# Patient Record
Sex: Female | Born: 1990 | Race: Black or African American | Hispanic: No | Marital: Married | Smoking: Never smoker
Health system: Southern US, Community
[De-identification: ages and names within clinical notes are randomized; demographics above are authoritative.]

## PROBLEM LIST (undated history)

## (undated) ENCOUNTER — Inpatient Hospital Stay (HOSPITAL_COMMUNITY): Payer: Self-pay

## (undated) DIAGNOSIS — Z789 Other specified health status: Secondary | ICD-10-CM

## (undated) HISTORY — PX: NO PAST SURGERIES: SHX2092

---
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
## 2009-04-06 ENCOUNTER — Inpatient Hospital Stay (HOSPITAL_COMMUNITY): Admission: AD | Admit: 2009-04-06 | Discharge: 2009-04-06 | Payer: Self-pay | Admitting: Obstetrics and Gynecology

## 2009-07-12 ENCOUNTER — Ambulatory Visit (HOSPITAL_COMMUNITY): Admission: RE | Admit: 2009-07-12 | Discharge: 2009-07-12 | Payer: Self-pay | Admitting: Obstetrics

## 2009-10-25 IMAGING — US US OB COMP LESS 14 WK
1 series · 14 of 28 positions shown · non-contrast
Comparison: none

CLINICAL DATA: Positive pregnancy test. Vaginal bleeding. 
OBSTETRICAL ULTRASOUND <14 WKS AND TRANSVAGINAL OB US:
TECHNIQUE: Both transabdominal and transvaginal ultrasound examinations were performed for complete evaluation of the gestation as well as the maternal uterus, adnexal regions, and pelvic cul-de-sac.

[Series 1: us ob comp less 14 wk · 0.28mm/px · 14 of 31 slices shown]
[im 2/31]
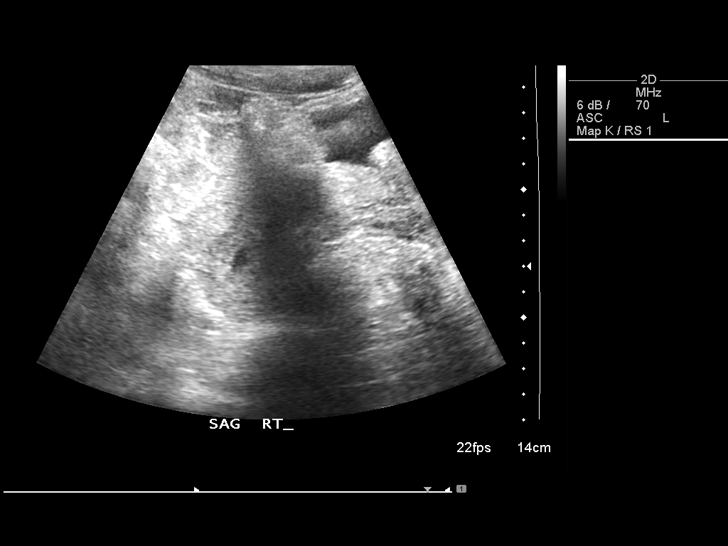
[im 4/31]
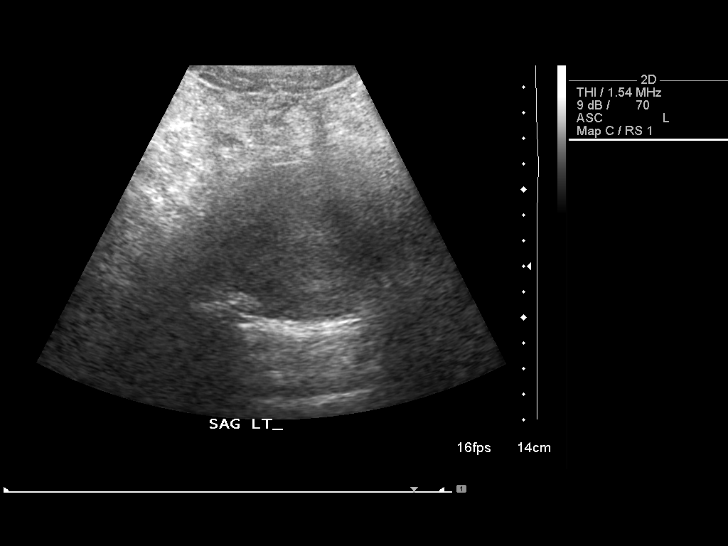
[im 6/31]
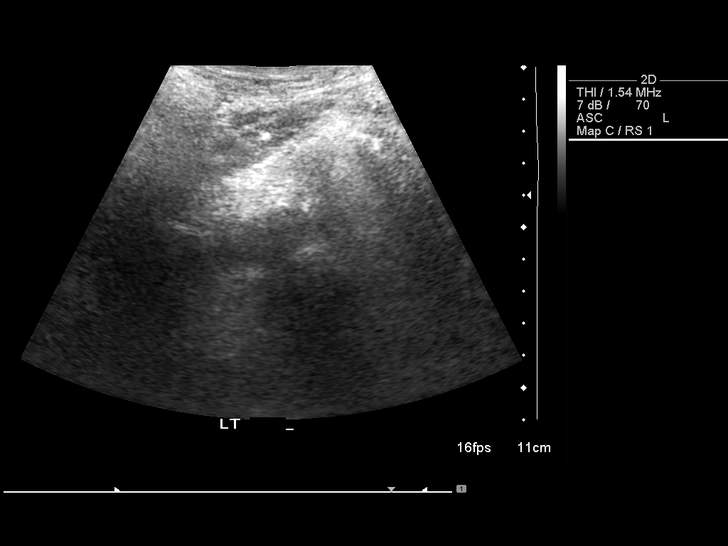
[im 8/31]
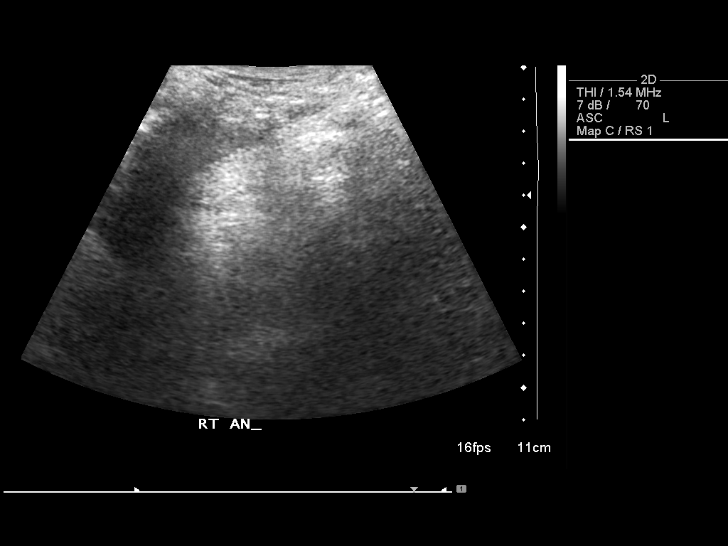
[im 11/31]
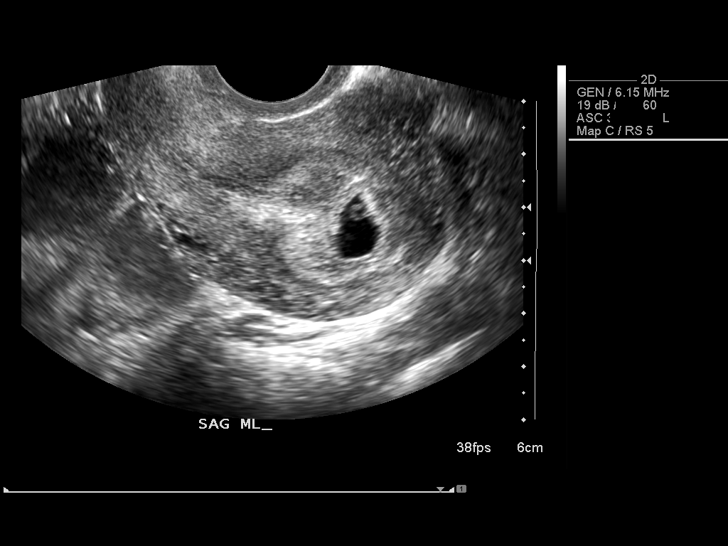
[im 13/31]
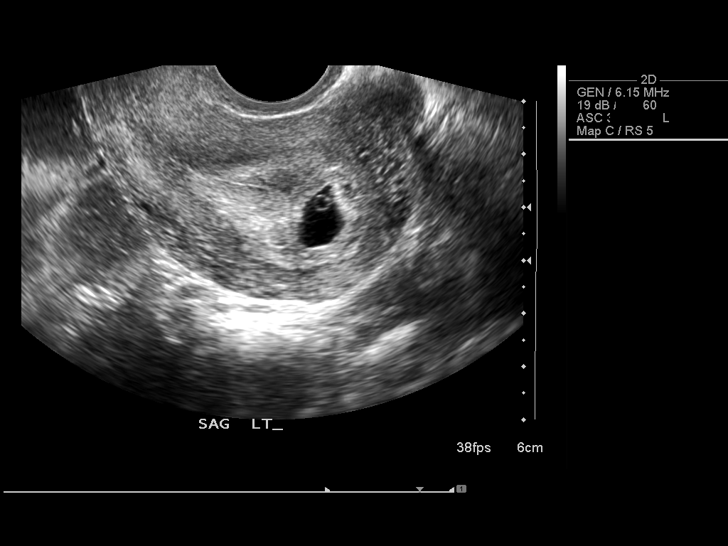
[im 15/31]
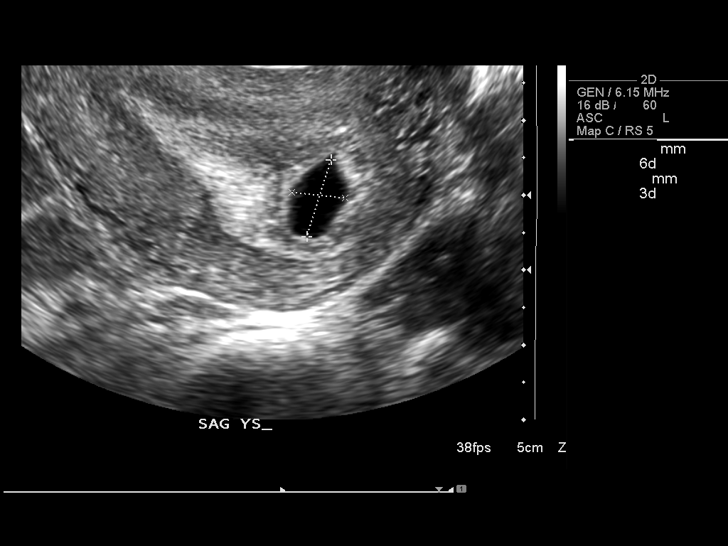
[im 17/31]
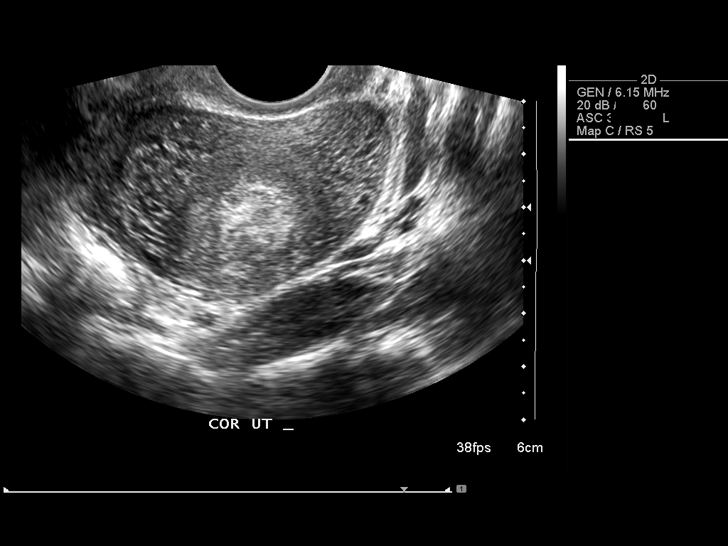
[im 19/31]
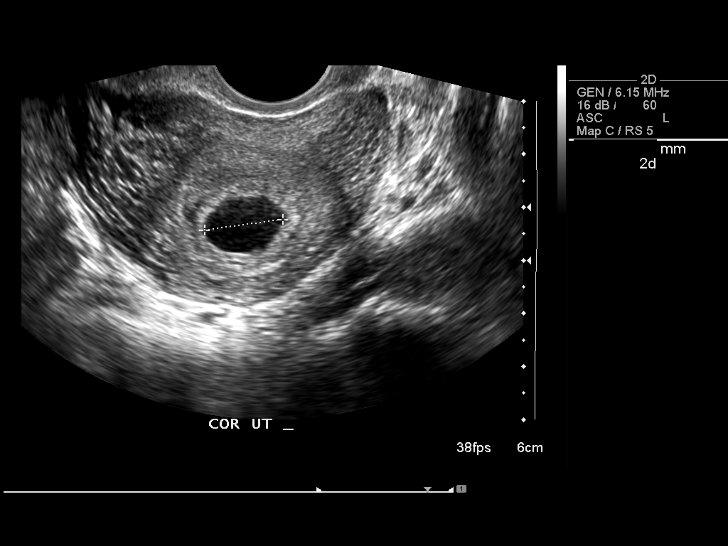
[im 22/31]
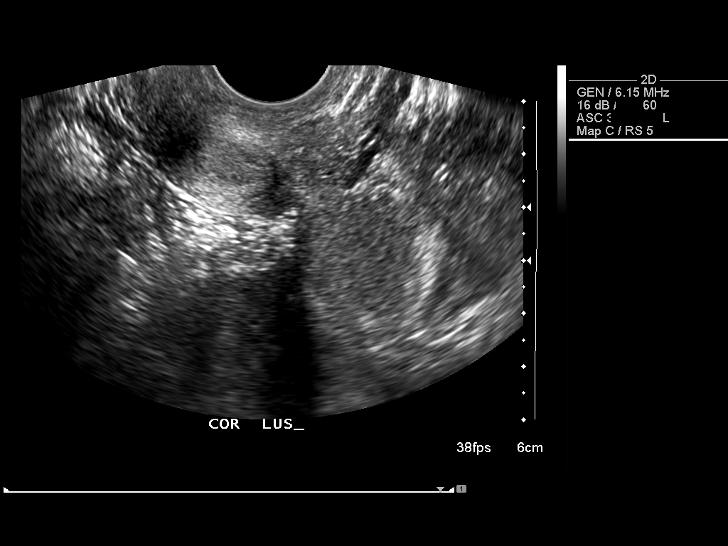
[im 24/31]
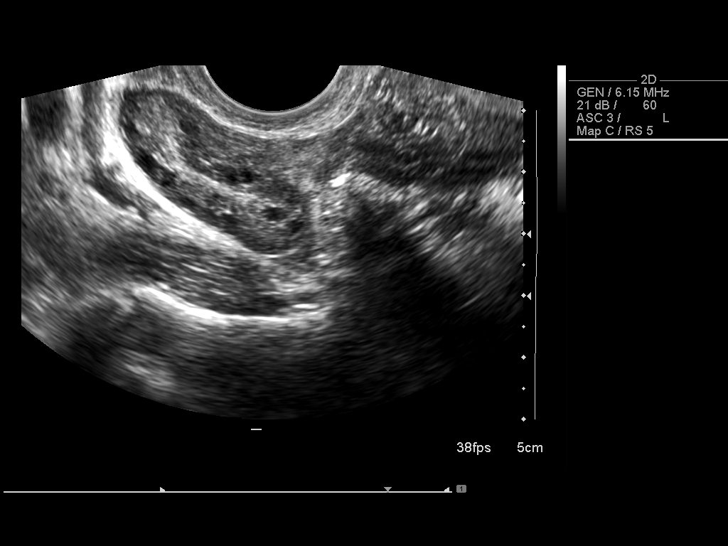
[im 26/31]
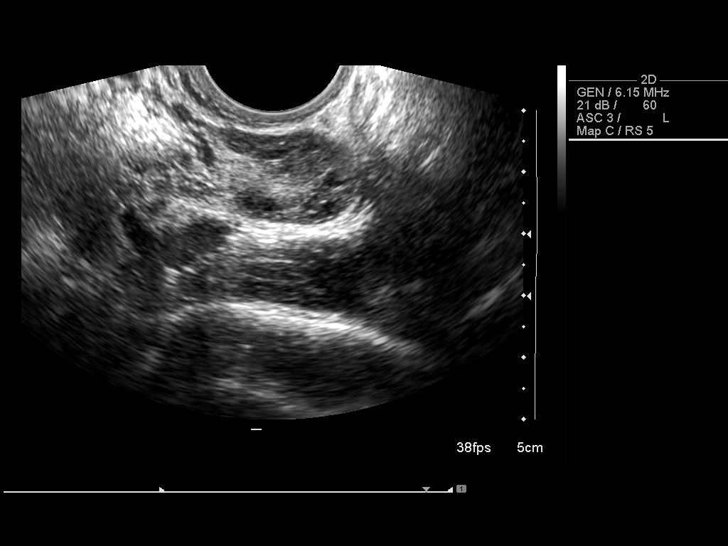
[im 28/31]
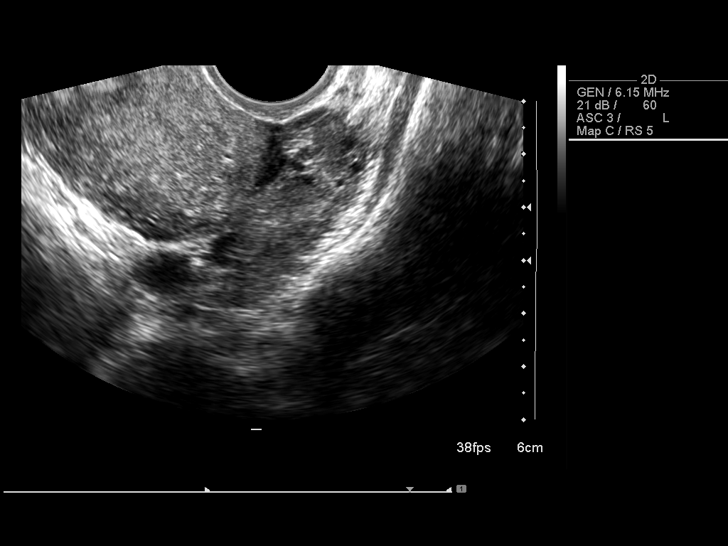
[im 31/31]
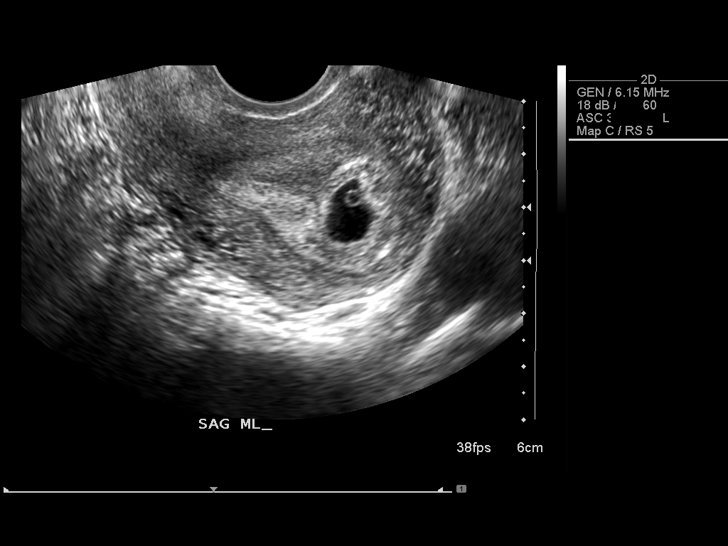

[14 of 28 positions shown; findings below may reference images not displayed]

FINDINGS: A single intrauterine gestational sac is identified.  Yolk sac is visualized, but no embryo is seen.  Mean sac diameter is 11 mm for an estimated gestational age of 5 weeks 6 days.  No subchorionic hemorrhage is seen.  The ovaries are unremarkable in appearance.
IMPRESSION: Intrauterine gestational sac with a normal appearing yolk sac but no embryo visualized.  Recommend followup serial quantitative HCG and ultrasound as indicated.

## 2010-11-26 LAB — CBC
HCT: 39.4 % (ref 36.0–46.0)
Hemoglobin: 13.4 g/dL (ref 12.0–15.0)
MCV: 93.1 fL (ref 78.0–100.0)
RBC: 4.23 MIL/uL (ref 3.87–5.11)
WBC: 4.2 10*3/uL (ref 4.0–10.5)

## 2010-11-26 LAB — URINALYSIS, ROUTINE W REFLEX MICROSCOPIC
Glucose, UA: NEGATIVE mg/dL
Ketones, ur: 15 mg/dL — AB
Nitrite: NEGATIVE
Protein, ur: NEGATIVE mg/dL
Specific Gravity, Urine: >1.03 — ABNORMAL HIGH (ref 1.005–1.030)

## 2010-11-26 LAB — COMPREHENSIVE METABOLIC PANEL
ALT: 10 U/L (ref 0–35)
AST: 12 U/L (ref 0–37)
Alkaline Phosphatase: 96 U/L (ref 39–117)
BUN: 9 mg/dL (ref 6–23)
Creatinine, Ser: 0.78 mg/dL (ref 0.4–1.2)
GFR calc non Af Amer: >60 mL/min (ref >60)
Glucose, Bld: 88 mg/dL (ref 70–99)

## 2010-11-26 LAB — GC/CHLAMYDIA PROBE AMP, GENITAL: Chlamydia, DNA Probe: NEGATIVE

## 2010-11-26 LAB — POCT PREGNANCY, URINE: Preg Test, Ur: NEGATIVE

## 2010-11-26 LAB — WET PREP, GENITAL

## 2011-05-11 LAB — URINALYSIS, ROUTINE W REFLEX MICROSCOPIC
Bilirubin Urine: NEGATIVE
Ketones, ur: NEGATIVE
Protein, ur: NEGATIVE

## 2011-05-11 LAB — WET PREP, GENITAL
Trich, Wet Prep: NONE SEEN
Yeast Wet Prep HPF POC: NONE SEEN

## 2011-05-11 LAB — URINE MICROSCOPIC-ADD ON: RBC / HPF: NONE SEEN

## 2011-05-11 LAB — URINE CULTURE: Colony Count: 9000

## 2011-05-15 LAB — CBC
HCT: 32.6 — ABNORMAL LOW
MCV: 93.3
Platelets: 180
RBC: 3.49 — ABNORMAL LOW
WBC: 9

## 2011-05-15 LAB — URINALYSIS, ROUTINE W REFLEX MICROSCOPIC
Ketones, ur: NEGATIVE
Specific Gravity, Urine: >1.03 — ABNORMAL HIGH
pH: 6

## 2011-05-15 LAB — URINE MICROSCOPIC-ADD ON

## 2011-05-15 LAB — DIFFERENTIAL
Eosinophils Absolute: 0.2
Lymphocytes Relative: 20
Lymphs Abs: 1.8
Neutro Abs: 6.6
Neutrophils Relative %: 74

## 2011-05-15 LAB — BASIC METABOLIC PANEL
CO2: 22
Chloride: 106
GFR calc non Af Amer: >60
Glucose, Bld: 86

## 2011-05-15 LAB — WET PREP, GENITAL: Yeast Wet Prep HPF POC: NONE SEEN

## 2011-05-15 LAB — URINE CULTURE

## 2011-05-15 LAB — GC/CHLAMYDIA PROBE AMP, GENITAL: Chlamydia, DNA Probe: NEGATIVE

## 2011-05-19 LAB — CBC
HCT: 33.1 — ABNORMAL LOW
MCV: 87.7
Platelets: 132 — ABNORMAL LOW
Platelets: 146 — ABNORMAL LOW
RDW: 15.1
RDW: 15.4

## 2011-05-19 LAB — DIFFERENTIAL
Basophils Absolute: 0
Eosinophils Absolute: 0
Eosinophils Relative: 0
Lymphocytes Relative: 12

## 2011-05-19 LAB — RPR: RPR Ser Ql: NONREACTIVE

## 2011-05-29 LAB — WET PREP, GENITAL: Trich, Wet Prep: NONE SEEN

## 2011-05-29 LAB — CBC
Hemoglobin: 12.8
MCHC: 34.8
MCV: 89.5
RBC: 4.1

## 2011-05-29 LAB — URINALYSIS, ROUTINE W REFLEX MICROSCOPIC
Ketones, ur: NEGATIVE
Nitrite: NEGATIVE
Protein, ur: NEGATIVE

## 2011-05-29 LAB — POCT PREGNANCY, URINE: Preg Test, Ur: POSITIVE

## 2011-05-29 LAB — GC/CHLAMYDIA PROBE AMP, GENITAL: GC Probe Amp, Genital: NEGATIVE

## 2011-12-02 ENCOUNTER — Inpatient Hospital Stay (HOSPITAL_COMMUNITY): Payer: Medicaid Other

## 2011-12-02 ENCOUNTER — Encounter (HOSPITAL_COMMUNITY): Payer: Self-pay | Admitting: *Deleted

## 2011-12-02 ENCOUNTER — Inpatient Hospital Stay (HOSPITAL_COMMUNITY)
Admission: AD | Admit: 2011-12-02 | Discharge: 2011-12-02 | Disposition: A | Discharge status: Home / Self Care | Payer: Medicaid Other | Source: Ambulatory Visit | Attending: Obstetrics & Gynecology | Admitting: Obstetrics & Gynecology

## 2011-12-02 DIAGNOSIS — O26899 Other specified pregnancy related conditions, unspecified trimester: Secondary | ICD-10-CM

## 2011-12-02 DIAGNOSIS — R1032 Left lower quadrant pain: Secondary | ICD-10-CM | POA: Insufficient documentation

## 2011-12-02 DIAGNOSIS — O99891 Other specified diseases and conditions complicating pregnancy: Secondary | ICD-10-CM | POA: Insufficient documentation

## 2011-12-02 DIAGNOSIS — R109 Unspecified abdominal pain: Secondary | ICD-10-CM

## 2011-12-02 HISTORY — DX: Other specified health status: Z78.9

## 2011-12-02 LAB — POCT PREGNANCY, URINE: Preg Test, Ur: POSITIVE — AB

## 2011-12-02 LAB — CBC
MCH: 30.3 pg (ref 26.0–34.0)
MCHC: 32.3 g/dL (ref 30.0–36.0)
MCV: 93.8 fL (ref 78.0–100.0)
Platelets: 224 10*3/uL (ref 150–400)
RDW: 12.4 % (ref 11.5–15.5)
WBC: 8.4 10*3/uL (ref 4.0–10.5)

## 2011-12-02 LAB — URINALYSIS, ROUTINE W REFLEX MICROSCOPIC
Hgb urine dipstick: NEGATIVE
Protein, ur: NEGATIVE mg/dL
Urobilinogen, UA: 0.2 mg/dL (ref 0.0–1.0)

## 2011-12-02 LAB — WET PREP, GENITAL: Trich, Wet Prep: NONE SEEN

## 2011-12-02 NOTE — Progress Notes (Signed)
Henrietta Hoover PA in discussing u/s results and d/c plan. Will return Monday for repeat BHCG. Written and verbal d/c instructions given and understanding voiced.

## 2011-12-02 NOTE — Discharge Instructions (Signed)
Abdominal Pain During Pregnancy °Belly (abdominal) pain is common during pregnancy. Most of the time, it is not a serious problem. Other times, it can be a sign that something is wrong with the pregnancy. Always tell your doctor if you have belly pain. °HOME CARE °For mild pain: °· Do not have sex (intercourse) or put anything in your vagina until you feel better.  °· Rest until your pain stops. If your pain lasts longer than 1 hour, call your doctor.  °· Drink clear fluids if you feel sick to your stomach (nauseous).  °· Do not eat solid food until you feel better.  °· Only take medicine as told by your doctor.  °· Keep all doctor visits as told.  °GET HELP RIGHT AWAY IF:  °· You are bleeding, leaking fluid, or pieces of tissue come out of your vagina.  °· You have more pain or cramping.  °· You keep throwing up (vomiting).  °· You have pain when you pee (urinate) or have blood in your pee.  °· You have a fever.  °· You do not feel your baby moving as much.  °· You feel very weak or feel like passing out.  °· You have trouble breathing, with or without belly pain.  °· You have a very bad headache and belly pain.  °· You have fluid leaking from your vagina and belly pain.  °· You keep having watery poop (diarrhea).  °· Your belly pain does not go away after resting, or the pain gets worse.  °MAKE SURE YOU:  °· Understand these instructions.  °· Will watch your condition.  °· Will get help right away if you are not doing well or get worse.  °Document Released: 07/26/2009 Document Revised: 07/27/2011 Document Reviewed: 03/03/2011 °Exit[employer]® Patient Information ©2012 [employer]. °

## 2011-12-02 NOTE — MAU Provider Note (Signed)
History   Pt presents today c/o LLQ pain that has been coming and going for the past 1wk. She states the pain is located in one area and does not move. She denies vag dc, bleeding, fever, dysuria, or any other sx at this time.  CSN: [id number]  Arrival date and time: 12/02/11 0126   First Provider Initiated Contact with Patient 12/02/11 0205      Chief Complaint  Patient presents with  . Abdominal Pain   HPI  OB History    Grav Para Term Preterm Abortions TAB SAB Ect Mult Living   2 1 1  0 0 0 0 0 0 1      Past Medical History  Diagnosis Date  . No pertinent past medical history     Past Surgical History  Procedure Date  . No past surgeries     Family History  Problem Relation Age of Onset  . Anesthesia problems Neg Hx   . Hypotension Neg Hx   . Malignant hyperthermia Neg Hx   . Pseudochol deficiency Neg Hx     History  Substance Use Topics  . Smoking status: Never Smoker   . Smokeless tobacco: Not on file  . Alcohol Use: No    Allergies: No Known Allergies  Prescriptions prior to admission  Medication Sig Dispense Refill  . cyclobenzaprine (FLEXERIL) 10 MG tablet Take 10 mg by mouth 3 (three) times daily as needed. As needed for back spasms      . Multiple Vitamin (MULITIVITAMIN WITH MINERALS) TABS Take 1 tablet by mouth daily.        Review of Systems  Constitutional: Negative for fever and chills.  Eyes: Negative for blurred vision.  Cardiovascular: Negative for chest pain and palpitations.  Gastrointestinal: Positive for abdominal pain. Negative for nausea, vomiting, diarrhea and constipation.  Genitourinary: Negative for dysuria, urgency and frequency.  Neurological: Negative for dizziness and headaches.  Psychiatric/Behavioral: Negative for depression and suicidal ideas.   Physical Exam   Blood pressure 123/66, pulse 92, temperature 97.9 F (36.6 C), temperature source Oral, resp. rate 20, height 5\' 5"  (1.651 m), weight 119 lb 9.6 oz (54.25 kg),  last menstrual period 11/04/2011.  Physical Exam  Nursing note and vitals reviewed. Constitutional: She is oriented to person, place, and time. She appears well-developed and well-nourished. No distress.  HENT:  Head: Normocephalic and atraumatic.  Eyes: EOM are normal. Pupils are equal, round, and reactive to light.  GI: Soft. She exhibits no distension and no mass. There is tenderness. There is no rebound and no guarding.  Genitourinary: No bleeding around the vagina. Vaginal discharge found.       Thin, white vag dc present. Uterus NL size and shape with no obvious adnexal masses. Pt non-tender on exam.  Neurological: She is alert and oriented to person, place, and time.  Skin: Skin is warm and dry. She is not diaphoretic.  Psychiatric: She has a normal mood and affect. Her behavior is normal. Judgment and thought content normal.    MAU Course  Procedures  Wet prep and GC/Chlamydia cultures done.  Results for orders placed during the hospital encounter of 12/02/11 (from the past 24 hour(s))  URINALYSIS, ROUTINE W REFLEX MICROSCOPIC     Status: Abnormal   Collection Time   12/02/11  1:35 AM      Component Value Range   Color, Urine YELLOW  YELLOW    APPearance CLEAR  CLEAR    Specific Gravity, Urine >1.030 (*) 1.005 -  1.030    pH 6.0  5.0 - 8.0    Glucose, UA NEGATIVE  NEGATIVE (mg/dL)   Hgb urine dipstick NEGATIVE  NEGATIVE    Bilirubin Urine NEGATIVE  NEGATIVE    Ketones, ur NEGATIVE  NEGATIVE (mg/dL)   Protein, ur NEGATIVE  NEGATIVE (mg/dL)   Urobilinogen, UA 0.2  0.0 - 1.0 (mg/dL)   Nitrite NEGATIVE  NEGATIVE    Leukocytes, UA NEGATIVE  NEGATIVE   CBC     Status: Normal   Collection Time   12/02/11  1:45 AM      Component Value Range   WBC 8.4  4.0 - 10.5 (K/uL)   RBC 4.00  3.87 - 5.11 (MIL/uL)   Hemoglobin 12.1  12.0 - 15.0 (g/dL)   HCT 16.1  09.6 - 04.5 (%)   MCV 93.8  78.0 - 100.0 (fL)   MCH 30.3  26.0 - 34.0 (pg)   MCHC 32.3  30.0 - 36.0 (g/dL)   RDW 40.9   81.1 - 91.4 (%)   Platelets 224  150 - 400 (K/uL)  HCG, QUANTITATIVE, PREGNANCY     Status: Abnormal   Collection Time   12/02/11  1:45 AM      Component Value Range   hCG, Beta Chain, Quant, S 184 (*) <5 (mIU/mL)  POCT PREGNANCY, URINE     Status: Abnormal   Collection Time   12/02/11  1:45 AM      Component Value Range   Preg Test, Ur POSITIVE (*) NEGATIVE   WET PREP, GENITAL     Status: Abnormal   Collection Time   12/02/11  2:00 AM      Component Value Range   Yeast Wet Prep HPF POC NONE SEEN  NONE SEEN    Trich, Wet Prep NONE SEEN  NONE SEEN    Clue Cells Wet Prep HPF POC NONE SEEN  NONE SEEN    WBC, Wet Prep HPF POC MODERATE (*) NONE SEEN     US shows no IUP or ectopic preg. Assessment and Plan  Abd pain in early preg: discussed with pt at length. She will return on 12/04/11 for repeat B-quant or prn. Discussed signs and sx of ectopic preg with pt at length. Discussed diet, activity, risks, and precautions.  Clinton Gallant. Brytney Somes III, DrHSc, MPAS, PA-C  12/02/2011, 2:06 AM

## 2011-12-02 NOTE — MAU Note (Signed)
I've had abdominal pain for about a wk. Sharp, crampy and always there. Sometimes gets worse. Pain keeps me up and can't get sleep.

## 2011-12-04 ENCOUNTER — Inpatient Hospital Stay (HOSPITAL_COMMUNITY)
Admission: AD | Admit: 2011-12-04 | Discharge: 2011-12-04 | Disposition: A | Discharge status: Home / Self Care | Payer: Medicaid Other | Source: Ambulatory Visit | Attending: Obstetrics and Gynecology | Admitting: Obstetrics and Gynecology

## 2011-12-04 ENCOUNTER — Encounter (HOSPITAL_COMMUNITY): Payer: Self-pay

## 2011-12-04 DIAGNOSIS — Z09 Encounter for follow-up examination after completed treatment for conditions other than malignant neoplasm: Secondary | ICD-10-CM

## 2011-12-04 DIAGNOSIS — O99891 Other specified diseases and conditions complicating pregnancy: Secondary | ICD-10-CM | POA: Insufficient documentation

## 2011-12-04 LAB — HCG, QUANTITATIVE, PREGNANCY: hCG, Beta Chain, Quant, S: 594 m[IU]/mL — ABNORMAL HIGH (ref <5)

## 2011-12-04 LAB — GC/CHLAMYDIA PROBE AMP, GENITAL: Chlamydia, DNA Probe: NEGATIVE

## 2011-12-04 NOTE — MAU Note (Signed)
Patient to MAU for repeat BHCG, denies any pain or bleeding at this time.

## 2011-12-04 NOTE — MAU Provider Note (Signed)
Kristen French is a 21 y.o. female @ [redacted]w[redacted]d weeks gestation who presents to MAU for follow up Bhcg. She was here 2 days ago and the Bhcg was 184 and today has increased to 594. She denies pain or bleeding. We will schedule her for follow up ultrasound in one week. She will return sooner for any problems.  Results for orders placed during the hospital encounter of 12/04/11 (from the past 24 hour(s))  HCG, QUANTITATIVE, PREGNANCY     Status: Abnormal   Collection Time   12/04/11 10:03 AM      Component Value Range   hCG, Beta Chain, Quant, S 594 (*) <5 (mIU/mL)

## 2011-12-04 NOTE — Discharge Instructions (Signed)
YOUR PREGNANCY HORMONE LEVEL TODAY HAS INCREASED TO OVER 500. WE WILL NEED FOR YOU TO RETURN IN ONE WEEK FOR A FOLLOW UP ULTRASOUND. RETURN IMMEDIATELY FOR SEVERE PAIN, HEAVY BLEEDING, FEELING WEAK AND DIZZY OR OTHER PROBLEMS.

## 2011-12-04 NOTE — MAU Provider Note (Signed)
Agree with above note.  Kristen French 12/04/2011 1:36 PM

## 2011-12-05 NOTE — MAU Provider Note (Signed)
Attestation of Attending Supervision of Advanced Practitioner: Evaluation and management procedures were performed by the OB Fellow/PA/CNM/NP under my supervision and collaboration. Chart reviewed, and agree with management and plan.  Joesph Marcy, M.D. 12/05/2011 11:32 AM   

## 2011-12-12 ENCOUNTER — Ambulatory Visit (HOSPITAL_COMMUNITY)
Admit: 2011-12-12 | Discharge: 2011-12-12 | Disposition: A | Discharge status: Home / Self Care | Payer: Medicaid Other | Attending: Obstetrics and Gynecology | Admitting: Obstetrics and Gynecology

## 2011-12-12 ENCOUNTER — Inpatient Hospital Stay (HOSPITAL_COMMUNITY)
Admission: AD | Admit: 2011-12-12 | Discharge: 2011-12-12 | Disposition: A | Discharge status: Home / Self Care | Payer: Medicaid Other | Source: Ambulatory Visit | Attending: Obstetrics & Gynecology | Admitting: Obstetrics & Gynecology

## 2011-12-12 DIAGNOSIS — O99891 Other specified diseases and conditions complicating pregnancy: Secondary | ICD-10-CM | POA: Insufficient documentation

## 2011-12-12 DIAGNOSIS — O200 Threatened abortion: Secondary | ICD-10-CM

## 2011-12-12 DIAGNOSIS — R1032 Left lower quadrant pain: Secondary | ICD-10-CM | POA: Insufficient documentation

## 2011-12-12 DIAGNOSIS — O021 Missed abortion: Secondary | ICD-10-CM | POA: Insufficient documentation

## 2011-12-12 DIAGNOSIS — Z3689 Encounter for other specified antenatal screening: Secondary | ICD-10-CM | POA: Insufficient documentation

## 2011-12-12 DIAGNOSIS — O3680X0 Pregnancy with inconclusive fetal viability, not applicable or unspecified: Secondary | ICD-10-CM

## 2011-12-12 NOTE — MAU Note (Signed)
Patient to MAU after ultrasound to confirm viability. Patient denies any pain or bleeding. Plans to go to Dr. Gaynell Face for prenatal care.

## 2011-12-12 NOTE — MAU Provider Note (Signed)
  History     CSN: [id number]  Arrival date and time: 12/12/11 0954   None     Chief Complaint  Patient presents with  . Follow-up   HPI This is a .21 y.o. female at [redacted]w[redacted]d who presents from [place] for results of [place].  Last scan showed nothing in the uterus.   OB History    Grav Para Term Preterm Abortions TAB SAB Ect Mult Living   2 1 1  0 0 0 0 0 0 1      Past Medical History  Diagnosis Date  . No pertinent past medical history     Past Surgical History  Procedure Date  . No past surgeries     Family History  Problem Relation Age of Onset  . Anesthesia problems Neg Hx   . Hypotension Neg Hx   . Malignant hyperthermia Neg Hx   . Pseudochol deficiency Neg Hx     History  Substance Use Topics  . Smoking status: Never Smoker   . Smokeless tobacco: Not on file  . Alcohol Use: No    Allergies: No Known Allergies  Prescriptions prior to admission  Medication Sig Dispense Refill  . cyclobenzaprine (FLEXERIL) 10 MG tablet Take 10 mg by mouth 3 (three) times daily as needed. As needed for back spasms      . Multiple Vitamin (MULITIVITAMIN WITH MINERALS) TABS Take 1 tablet by mouth daily.        ROS Denies bleeding or pain  Physical Exam   Blood pressure 126/71, pulse 76, temperature 97.8 F (36.6 C), temperature source Oral, resp. rate 16, last menstrual period 11/04/2011, SpO2 100.00%.  Physical Exam US Ob Comp Less 14 Wks IUGS irregular, measures 6.0.wks, FP not seen well, ? Left Paraovarian cyst noted.  MAU Course  Procedures  MDM Discussed possible missed AB vs early gestation. Pt states her last pregnancy went the same way and baby was fine. Wants to wait and get one more scan  Assessment and Plan  A:  IUP      Missed AB vs early gestation  P:  Will check one more [place] next week      Call if has bleeding or pain   [facility] 12/12/2011, 10:31 AM

## 2011-12-12 NOTE — Discharge Instructions (Signed)
Threatened Miscarriage  A threatened miscarriage is a pregnancy that may end. It may be marked by bleeding during the first 20 weeks of pregnancy. Often, the pregnancy can continue without any more problems. You may be asked to stop:  Having sex (intercourse).   Having orgasms.   Using tampons.   Exercising.   Doing heavy physical activity and work.  HOME CARE   Your doctor may tell you to take bed rest and to stop activities and work.   Write down the number of pads you use each day. Write down how often you change pads. Write down how soaked they are.   Follow your doctor's advice for follow-up visits and tests.   If your blood type is Rh-negative and the father's blood is Rh-positive (or is not known), you may get a shot to protect the baby.   If you have a miscarriage, save all the tissue you pass in a container. Take the container to your doctor.  GET HELP RIGHT AWAY IF:   You have bad cramps or pain in your belly (abdomen), lower belly, or back.   You have a fever or chills.   Your bleeding gets worse or you pass large clots of blood or tissue. Save this tissue to show your doctor.   You feel lightheaded, weak, dizzy, or pass out (faint).   You have a gush of fluid from your vagina.  MAKE SURE YOU:   Understand these instructions.   Will watch your condition.   Will get help right away if you are not doing well or get worse.  Document Released: 07/20/2008 Document Revised: 07/27/2011 Document Reviewed: 08/23/2009 [employer] Patient Information 2012 [employer]. 

## 2011-12-15 ENCOUNTER — Other Ambulatory Visit (HOSPITAL_COMMUNITY): Payer: Self-pay | Admitting: Advanced Practice Midwife

## 2011-12-15 DIAGNOSIS — O3680X0 Pregnancy with inconclusive fetal viability, not applicable or unspecified: Secondary | ICD-10-CM

## 2011-12-16 ENCOUNTER — Inpatient Hospital Stay (HOSPITAL_COMMUNITY): Payer: Medicaid Other

## 2011-12-16 ENCOUNTER — Inpatient Hospital Stay (HOSPITAL_COMMUNITY)
Admission: AD | Admit: 2011-12-16 | Discharge: 2011-12-16 | Disposition: A | Discharge status: Home / Self Care | Payer: Medicaid Other | Source: Ambulatory Visit | Attending: Family Medicine | Admitting: Family Medicine

## 2011-12-16 ENCOUNTER — Encounter (HOSPITAL_COMMUNITY): Payer: Self-pay | Admitting: *Deleted

## 2011-12-16 DIAGNOSIS — N831 Corpus luteum cyst of ovary, unspecified side: Secondary | ICD-10-CM | POA: Insufficient documentation

## 2011-12-16 DIAGNOSIS — Z3400 Encounter for supervision of normal first pregnancy, unspecified trimester: Secondary | ICD-10-CM

## 2011-12-16 DIAGNOSIS — R109 Unspecified abdominal pain: Secondary | ICD-10-CM | POA: Insufficient documentation

## 2011-12-16 DIAGNOSIS — O34599 Maternal care for other abnormalities of gravid uterus, unspecified trimester: Secondary | ICD-10-CM | POA: Insufficient documentation

## 2011-12-16 LAB — URINALYSIS, ROUTINE W REFLEX MICROSCOPIC
Bilirubin Urine: NEGATIVE
Hgb urine dipstick: NEGATIVE
Ketones, ur: NEGATIVE mg/dL
Nitrite: NEGATIVE
Urobilinogen, UA: 1 mg/dL (ref 0.0–1.0)

## 2011-12-16 NOTE — MAU Note (Signed)
Abdominal pain since yesterday. No bleeding

## 2011-12-16 NOTE — MAU Provider Note (Signed)
Kristen French y.o.G1P1001 @[redacted]w[redacted]d  by LMP Chief Complaint  Patient presents with  . Abdominal Pain     First Provider Initiated Contact with Patient 12/16/11 2022      SUBJECTIVE  HPI: Pt is here for worsening LLQ pain since yesterday. She has been seen in MAU several times over the past two weeks for similar pain. Normally rising quants. [place] 12/12/11 showed irreg GS, +YS, but no FP.  She denies VB, UTI Sx, GI complaints. Declines pain meds.  Past Medical History  Diagnosis Date  . No pertinent past medical history    Past Surgical History  Procedure Date  . No past surgeries    History   Social History  . Marital Status: Single    Spouse Name: N/A    Number of Children: N/A  . Years of Education: N/A   Occupational History  . Not on file.   Social History Main Topics  . Smoking status: Never Smoker   . Smokeless tobacco: Not on file  . Alcohol Use: No  . Drug Use: No  . Sexually Active: Yes    Birth Control/ Protection: None   Other Topics Concern  . Not on file   Social History Narrative  . No narrative on file   No current facility-administered medications on file prior to encounter.   Current Outpatient Prescriptions on File Prior to Encounter  Medication Sig Dispense Refill  . famotidine (PEPCID) 20 MG tablet Take 20 mg by mouth 2 (two) times daily. For heartburn      . loratadine (CLARITIN) 10 MG tablet Take 10 mg by mouth daily. For allergies       No Known Allergies  ROS: Pertinent items in HPI  OBJECTIVE Blood pressure 115/69, pulse 71, temperature 98.7 F (37.1 C), temperature source Oral, resp. rate 20, height 5' 5.5" (1.664 m), weight 55.792 kg (123 lb), last menstrual period 11/04/2011, SpO2 97.00%.  GENERAL: Well-developed, well-nourished female in no acute distress.  HEENT: Normocephalic, good dentition HEART: normal rate RESP: normal effort ABDOMEN: Mildly distended, mild LLQ tenderness EXTREMITIES: Nontender, no edema NEURO: Alert and  oriented SPECULUM EXAM: deferred BIMANUAL: deferred   LAB RESULTS Results for orders placed during the hospital encounter of 12/16/11 (from the past 24 hour(s))  URINALYSIS, ROUTINE W REFLEX MICROSCOPIC     Status: Normal   Collection Time   12/16/11  8:00 PM      Component Value Range   Color, Urine YELLOW  YELLOW    APPearance CLEAR  CLEAR    Specific Gravity, Urine 1.015  1.005 - 1.030    pH 7.0  5.0 - 8.0    Glucose, UA NEGATIVE  NEGATIVE (mg/dL)   Hgb urine dipstick NEGATIVE  NEGATIVE    Bilirubin Urine NEGATIVE  NEGATIVE    Ketones, ur NEGATIVE  NEGATIVE (mg/dL)   Protein, ur NEGATIVE  NEGATIVE (mg/dL)   Urobilinogen, UA 1.0  0.0 - 1.0 (mg/dL)   Nitrite NEGATIVE  NEGATIVE    Leukocytes, UA NEGATIVE  NEGATIVE    IMAGING SIUP S=D, FHR 113  ASSESSMENT 1. Corpus luteum cyst   2. Normal pregnancy, first    PLAN D/C home Follow-up Information    Please follow up. (Start prenatal care)       Follow up with [facility]. (As needed if symptoms worsen)    Contact information:   [address] [place] [address] [phone]        Medication List  As of 12/20/2011  8:05 AM   CONTINUE taking these medications         famotidine 20 MG tablet   Commonly known as: PEPCID      loratadine 10 MG tablet   Commonly known as: CLARITIN      prenatal multivitamin Tabs          SAB precautions  Dorathy Kinsman 12/16/2011 9:26 PM

## 2011-12-18 ENCOUNTER — Ambulatory Visit (HOSPITAL_COMMUNITY): Payer: Self-pay

## 2011-12-19 ENCOUNTER — Ambulatory Visit (HOSPITAL_COMMUNITY): Admit: 2011-12-19 | Payer: Self-pay

## 2011-12-20 ENCOUNTER — Encounter (HOSPITAL_COMMUNITY): Payer: Self-pay

## 2011-12-20 ENCOUNTER — Inpatient Hospital Stay (HOSPITAL_COMMUNITY): Payer: Medicaid Other

## 2011-12-20 ENCOUNTER — Inpatient Hospital Stay (HOSPITAL_COMMUNITY)
Admission: AD | Admit: 2011-12-20 | Discharge: 2011-12-20 | Disposition: A | Discharge status: Home / Self Care | Payer: Medicaid Other | Source: Ambulatory Visit | Attending: Obstetrics and Gynecology | Admitting: Obstetrics and Gynecology

## 2011-12-20 DIAGNOSIS — O219 Vomiting of pregnancy, unspecified: Secondary | ICD-10-CM

## 2011-12-20 DIAGNOSIS — O468X1 Other antepartum hemorrhage, first trimester: Secondary | ICD-10-CM

## 2011-12-20 DIAGNOSIS — O210 Mild hyperemesis gravidarum: Secondary | ICD-10-CM | POA: Insufficient documentation

## 2011-12-20 DIAGNOSIS — O200 Threatened abortion: Secondary | ICD-10-CM | POA: Insufficient documentation

## 2011-12-20 LAB — WET PREP, GENITAL: Trich, Wet Prep: NONE SEEN

## 2011-12-20 MED ORDER — ONDANSETRON 8 MG PO TBDP
8.0000 mg | ORAL_TABLET | Freq: Once | ORAL | Status: DC
  Filled 2011-12-20: qty 1

## 2011-12-20 MED ORDER — ONDANSETRON HCL 8 MG PO TABS: 8.0000 mg | ORAL_TABLET | Freq: Three times a day (TID) | ORAL | Status: AC | PRN

## 2011-12-20 MED ORDER — PROMETHAZINE HCL 25 MG PO TABS: 25.0000 mg | ORAL_TABLET | Freq: Four times a day (QID) | ORAL | Status: DC | PRN

## 2011-12-20 NOTE — MAU Provider Note (Signed)
Kristen French y.o.G2P1001 @[redacted]w[redacted]d  by LMP Chief Complaint  Patient presents with  . Vaginal Bleeding     First Provider Initiated Contact with Patient 12/20/11 1804      SUBJECTIVE  HPI: 6.4 week confirmed IUP. New onset bleeding, passage of ? Brown clot/tissue. Denies cramping, recent IC, hematuria. Past Medical History  Diagnosis Date  . No pertinent past medical history    Past Surgical History  Procedure Date  . No past surgeries    History   Social History  . Marital Status: Single    Spouse Name: N/A    Number of Children: N/A  . Years of Education: N/A   Occupational History  . Not on file.   Social History Main Topics  . Smoking status: Never Smoker   . Smokeless tobacco: Not on file  . Alcohol Use: No  . Drug Use: No  . Sexually Active: Yes    Birth Control/ Protection: None   Other Topics Concern  . Not on file   Social History Narrative  . No narrative on file   No current facility-administered medications on file prior to encounter.   Current Outpatient Prescriptions on File Prior to Encounter  Medication Sig Dispense Refill  . famotidine (PEPCID) 20 MG tablet Take 20 mg by mouth 2 (two) times daily. For heartburn      . loratadine (CLARITIN) 10 MG tablet Take 10 mg by mouth daily. For allergies      . Prenatal Vit-Fe Fumarate-FA (PRENATAL MULTIVITAMIN) TABS Take 1 tablet by mouth daily.       No Known Allergies  ROS: Pertinent items in HPI  OBJECTIVE Last menstrual period 11/04/2011.  GENERAL: Well-developed, well-nourished female in no acute distress.  HEENT: Normocephalic, good dentition HEART: normal rate RESP: normal effort ABDOMEN: Soft, nontender EXTREMITIES: Nontender, no edema NEURO: Alert and oriented SPECULUM EXAM: Small amount of brown blood and mucus noted, cervix friable BIMANUAL: cervix 0; uterus 7 weeks size. No adnexal tenderness or masses   LAB RESULTS Results for orders placed during the hospital encounter of  12/20/11 (from the past 24 hour(s))  WET PREP, GENITAL     Status: Abnormal   Collection Time   12/20/11  7:24 PM      Component Value Range   Yeast Wet Prep HPF POC NONE SEEN  NONE SEEN    Trich, Wet Prep NONE SEEN  NONE SEEN    Clue Cells Wet Prep HPF POC NONE SEEN  NONE SEEN    WBC, Wet Prep HPF POC FEW (*) NONE SEEN    IMAGING [place]: SIUP, S=D FHR 128  ASSESSMENT 1. Threatened abortion in first trimester   2. Subchorionic bleed, first trimester   3. Nausea/vomiting in pregnancy    PLAN D/C home Start prenatal care. List of providers given Medication List  As of 12/20/2011  7:49 PM   START taking these medications         ondansetron 8 MG tablet   Commonly known as: ZOFRAN   Take 1 tablet (8 mg total) by mouth every 8 (eight) hours as needed for nausea.      promethazine 25 MG tablet   Commonly known as: PHENERGAN   Take 1 tablet (25 mg total) by mouth every 6 (six) hours as needed for nausea.         CONTINUE taking these medications         famotidine 20 MG tablet   Commonly known as: PEPCID  loratadine 10 MG tablet   Commonly known as: CLARITIN      prenatal multivitamin Tabs          Where to get your medications    These are the prescriptions that you need to pick up.   You may get these medications from any pharmacy.         ondansetron 8 MG tablet   promethazine 25 MG tablet          Pelvic rest x 1 week. SAB precautions  Dorathy Kinsman 12/20/2011 6:08 PM

## 2011-12-20 NOTE — MAU Note (Signed)
Had [place] Saturday, FHT noted, today starting bleeding with one clot, brown blood on pad, cramping

## 2011-12-20 NOTE — MAU Note (Signed)
Smith,CNM states she had told pt she was going to be discharged. Verbal instructions given per provider

## 2011-12-20 NOTE — MAU Provider Note (Signed)
Chart reviewed and agree with management and plan.  

## 2011-12-20 NOTE — MAU Note (Signed)
Pt states has had brown bleeding, lower abd pain. Was seen in MAU days ago with same issue.

## 2011-12-20 NOTE — MAU Note (Signed)
Pt not in room.

## 2011-12-20 NOTE — Discharge Instructions (Signed)
Prenatal Care [facility] OB/GYN (Midwives)    [facility] OB/[facility]N  & Infertility  [phone]     Phone: [phone]          [facility]                      [facility]  @[place]     Phone: [phone]  Phone: [phone]         [facility]     Phone: [phone]  Phone: [phone]           [facility] (Midwives) [facility] [place] (Midwives)                hone: [phone]  Phone: [phone]         [facility] [address]ise Ceo      Phone: [phone]  Phone: [phone]         [facility]                Phone: [phone]  [facility]   Phone:[phone]    Family [address] [place])          Phone: [phone] [facility] &Infertility   Phone: [phone]   Vaginal Bleeding During Pregnancy, First Trimester A small amount of bleeding (spotting) is relatively common in early pregnancy. It usually stops on its own. There are many causes for bleeding or spotting in early pregnancy. Some bleeding may be related to the pregnancy and some may not. Cramping with the bleeding is more serious and concerning. Tell your caregiver if you have any vaginal bleeding.  CAUSES   It is normal in most cases.   The pregnancy ends (miscarriage).   The pregnancy may end (threatened miscarriage).   Infection or inflammation of the cervix.   Growths (polyps) on the cervix.   Pregnancy happens outside of the uterus and in a fallopian tube (tubal pregnancy).   Many tiny cysts in the uterus instead of pregnancy tissue (molar pregnancy).  SYMPTOMS  Vaginal bleeding or spotting with or without cramps. DIAGNOSIS  To evaluate the pregnancy, your caregiver may:  Do a pelvic exam.   Take blood tests.   Do an ultrasound.  It is very important to follow your caregiver's instructions.  TREATMENT   Evaluation of the  pregnancy with blood tests and ultrasound.   Bed rest (getting up to use the bathroom only).   Rho-gam immunization if the mother is Rh negative and the father is Rh positive.  HOME CARE INSTRUCTIONS   If your caregiver orders bed rest, you may need to make arrangements for the care of other children and for other responsibilities. However, your caregiver may allow you to continue light activity.   Keep track of the number of pads you use each day, how often you change pads and how soaked (saturated) they are. Write this down.   Do not use tampons. Do not douche.   Do not have sexual intercourse or orgasms until approved by your physician.   Save any tissue that you pass for your caregiver to see.   Take medicine for cramps only with your caregiver's permission.   Do not take aspirin because it can make you bleed.  SEEK IMMEDIATE MEDICAL CARE IF:   You experience severe cramps in your stomach, back or belly (abdomen).   You have an oral temperature  above 102 F (38.9 C), not controlled by medicine.   You pass large clots or tissue.   Your bleeding increases or you become light-headed, weak or have fainting episodes.   You develop chills.   You are leaking or have a gush of fluid from your vagina.   You pass out while having a bowel movement. That may mean you have a ruptured tubal pregnancy.  Document Released: 05/17/2005 Document Revised: 07/27/2011 Document Reviewed: 11/26/2008 [facility] Patient Information 2012 [place], [place].

## 2011-12-21 LAB — GC/CHLAMYDIA PROBE AMP, GENITAL
Chlamydia, DNA Probe: NEGATIVE
GC Probe Amp, Genital: NEGATIVE

## 2011-12-21 NOTE — MAU Provider Note (Signed)
Agree with above note.  Christpoher Sievers 12/21/2011 1:33 PM

## 2014-06-22 ENCOUNTER — Encounter (HOSPITAL_COMMUNITY): Payer: Self-pay

## 2014-12-06 ENCOUNTER — Inpatient Hospital Stay (HOSPITAL_COMMUNITY)
Admission: AD | Admit: 2014-12-06 | Discharge: 2014-12-06 | Disposition: A | Discharge status: Home / Self Care | Payer: Medicaid Other | Source: Ambulatory Visit | Attending: Obstetrics and Gynecology | Admitting: Obstetrics and Gynecology

## 2014-12-06 ENCOUNTER — Inpatient Hospital Stay (HOSPITAL_COMMUNITY): Payer: Medicaid Other

## 2014-12-06 ENCOUNTER — Encounter (HOSPITAL_COMMUNITY): Payer: Self-pay | Admitting: *Deleted

## 2014-12-06 DIAGNOSIS — R1031 Right lower quadrant pain: Secondary | ICD-10-CM | POA: Diagnosis not present

## 2014-12-06 DIAGNOSIS — Z8742 Personal history of other diseases of the female genital tract: Secondary | ICD-10-CM

## 2014-12-06 LAB — URINALYSIS, ROUTINE W REFLEX MICROSCOPIC
Bilirubin Urine: NEGATIVE
Glucose, UA: NEGATIVE mg/dL
Hgb urine dipstick: NEGATIVE
KETONES UR: NEGATIVE mg/dL
LEUKOCYTES UA: NEGATIVE
Nitrite: NEGATIVE
Protein, ur: NEGATIVE mg/dL
Specific Gravity, Urine: >1.03 — ABNORMAL HIGH (ref 1.005–1.030)
Urobilinogen, UA: 0.2 mg/dL (ref 0.0–1.0)
pH: 6 (ref 5.0–8.0)

## 2014-12-06 LAB — CBC WITH DIFFERENTIAL/PLATELET
BASOS PCT: 0 % (ref 0–1)
Basophils Absolute: 0 10*3/uL (ref 0.0–0.1)
EOS ABS: 0.2 10*3/uL (ref 0.0–0.7)
Eosinophils Relative: 4 % (ref 0–5)
HEMATOCRIT: 38.2 % (ref 36.0–46.0)
HEMOGLOBIN: 12.1 g/dL (ref 12.0–15.0)
LYMPHS PCT: 34 % (ref 12–46)
Lymphs Abs: 1.8 10*3/uL (ref 0.7–4.0)
MCH: 30.2 pg (ref 26.0–34.0)
MCHC: 31.7 g/dL (ref 30.0–36.0)
MCV: 95.3 fL (ref 78.0–100.0)
MONO ABS: 0.3 10*3/uL (ref 0.1–1.0)
Monocytes Relative: 5 % (ref 3–12)
Neutro Abs: 3.1 10*3/uL (ref 1.7–7.7)
Neutrophils Relative %: 57 % (ref 43–77)
Platelets: 200 10*3/uL (ref 150–400)
RBC: 4.01 MIL/uL (ref 3.87–5.11)
RDW: 12.2 % (ref 11.5–15.5)
WBC: 5.4 10*3/uL (ref 4.0–10.5)

## 2014-12-06 LAB — WET PREP, GENITAL
CLUE CELLS WET PREP: NONE SEEN
TRICH WET PREP: NONE SEEN
Yeast Wet Prep HPF POC: NONE SEEN

## 2014-12-06 LAB — POCT PREGNANCY, URINE: PREG TEST UR: NEGATIVE

## 2014-12-06 MED ORDER — IBUPROFEN 600 MG PO TABS: 600.0000 mg | ORAL_TABLET | Freq: Four times a day (QID) | ORAL | Status: DC | PRN

## 2014-12-06 MED ORDER — KETOROLAC TROMETHAMINE 60 MG/2ML IM SOLN
60.0000 mg | INTRAMUSCULAR | Status: AC
  Administered 2014-12-06: 60 mg via INTRAMUSCULAR
  Filled 2014-12-06: qty 2

## 2014-12-06 NOTE — MAU Note (Signed)
Pt presents to MAU with complaints of pain in her right lower abdomen since this morning around 8. Denies any vaginal bleeding or discharge

## 2014-12-06 NOTE — Discharge Instructions (Signed)
Abdominal Pain, Women °Abdominal (stomach, pelvic, or belly) pain can be caused by many things. It is important to tell your doctor: °· The location of the pain. °· Does it come and go or is it present all the time? °· Are there things that start the pain (eating certain foods, exercise)? °· Are there other symptoms associated with the pain (fever, nausea, vomiting, diarrhea)? °All of this is helpful to know when trying to find the cause of the pain. °CAUSES  °· Stomach: virus or bacteria infection, or ulcer. °· Intestine: appendicitis (inflamed appendix), regional ileitis (Crohn's disease), ulcerative colitis (inflamed colon), irritable bowel syndrome, diverticulitis (inflamed diverticulum of the colon), or cancer of the stomach or intestine. °· Gallbladder disease or stones in the gallbladder. °· Kidney disease, kidney stones, or infection. °· Pancreas infection or cancer. °· Fibromyalgia (pain disorder). °· Diseases of the female organs: °¨ Uterus: fibroid (non-cancerous) tumors or infection. °¨ Fallopian tubes: infection or tubal pregnancy. °¨ Ovary: cysts or tumors. °¨ Pelvic adhesions (scar tissue). °¨ Endometriosis (uterus lining tissue growing in the pelvis and on the pelvic organs). °¨ Pelvic congestion syndrome (female organs filling up with blood just before the menstrual period). °¨ Pain with the menstrual period. °¨ Pain with ovulation (producing an egg). °¨ Pain with an IUD (intrauterine device, birth control) in the uterus. °¨ Cancer of the female organs. °· Functional pain (pain not caused by a disease, may improve without treatment). °· Psychological pain. °· Depression. °DIAGNOSIS  °Your doctor will decide the seriousness of your pain by doing an examination. °· Blood tests. °· X-rays. °· Ultrasound. °· CT scan (computed tomography, special type of X-ray). °· MRI (magnetic resonance imaging). °· Cultures, for infection. °· Barium enema (dye inserted in the large intestine, to better view it with  X-rays). °· Colonoscopy (looking in intestine with a lighted tube). °· Laparoscopy (minor surgery, looking in abdomen with a lighted tube). °· Major abdominal exploratory surgery (looking in abdomen with a large incision). °TREATMENT  °The treatment will depend on the cause of the pain.  °· Many cases can be observed and treated at home. °· Over-the-counter medicines recommended by your caregiver. °· Prescription medicine. °· Antibiotics, for infection. °· Birth control pills, for painful periods or for ovulation pain. °· Hormone treatment, for endometriosis. °· Nerve blocking injections. °· Physical therapy. °· Antidepressants. °· Counseling with a psychologist or psychiatrist. °· Minor or major surgery. °HOME CARE INSTRUCTIONS  °· Do not take laxatives, unless directed by your caregiver. °· Take over-the-counter pain medicine only if ordered by your caregiver. Do not take aspirin because it can cause an upset stomach or bleeding. °· Try a clear liquid diet (broth or water) as ordered by your caregiver. Slowly move to a bland diet, as tolerated, if the pain is related to the stomach or intestine. °· Have a thermometer and take your temperature several times a day, and record it. °· Bed rest and sleep, if it helps the pain. °· Avoid sexual intercourse, if it causes pain. °· Avoid stressful situations. °· Keep your follow-up appointments and tests, as your caregiver orders. °· If the pain does not go away with medicine or surgery, you may try: °¨ Acupuncture. °¨ Relaxation exercises (yoga, meditation). °¨ Group therapy. °¨ Counseling. °SEEK MEDICAL CARE IF:  °· You notice certain foods cause stomach pain. °· Your home care treatment is not helping your pain. °· You need stronger pain medicine. °· You want your IUD removed. °· You feel faint or   lightheaded. °· You develop nausea and vomiting. °· You develop a rash. °· You are having side effects or an allergy to your medicine. °SEEK IMMEDIATE MEDICAL CARE IF:  °· Your  pain does not go away or gets worse. °· You have a fever. °· Your pain is felt only in portions of the abdomen. The right side could possibly be appendicitis. The left lower portion of the abdomen could be colitis or diverticulitis. °· You are passing blood in your stools (bright red or black tarry stools, with or without vomiting). °· You have blood in your urine. °· You develop chills, with or without a fever. °· You pass out. °MAKE SURE YOU:  °· Understand these instructions. °· Will watch your condition. °· Will get help right away if you are not doing well or get worse. °Document Released: 06/04/2007 Document Revised: 12/22/2013 Document Reviewed: 06/24/2009 °Exit[employer]® Patient Information ©2015 [employer]. This information is not intended to replace advice given to you by your health care provider. Make sure you discuss any questions you have with your health care provider. ° °

## 2014-12-06 NOTE — MAU Provider Note (Signed)
Chief Complaint: Abdominal Pain   First Provider Initiated Contact with Patient 12/06/14 1535      SUBJECTIVE HPI: Kristen French is a 24 y.o. [id number] who presents to maternity admissions reporting RLQ pain with onset this morning. The pain started while she was driving from [place] today.  The pain is described as constant, sharp, radiating down her thighs and into her back.  She has not taken anything for her pain. She reports the pain is worsening since it started.  She has hx of ovarian cysts and reports this feels similar, but worse than in the past.  She denies vaginal bleeding, vaginal itching/burning, urinary symptoms, h/a, dizziness, n/v, or fever/chills.    Past Medical History  Diagnosis Date  . No pertinent past medical history    Past Surgical History  Procedure Laterality Date  . No past surgeries     History   Social History  . Marital Status: Married    Spouse Name: N/A  . Number of Children: N/A  . Years of Education: N/A   Occupational History  . Not on file.   Social History Main Topics  . Smoking status: Never Smoker   . Smokeless tobacco: Never Used  . Alcohol Use: No  . Drug Use: No  . Sexual Activity: Yes    Birth Control/ Protection: None   Other Topics Concern  . Not on file   Social History Narrative   No current facility-administered medications on file prior to encounter.   Current Outpatient Prescriptions on File Prior to Encounter  Medication Sig Dispense Refill  . loratadine (CLARITIN) 10 MG tablet Take 10 mg by mouth daily. For allergies    . promethazine (PHENERGAN) 25 MG tablet Take 1 tablet (25 mg total) by mouth every 6 (six) hours as needed for nausea. 30 tablet 1   No Known Allergies  ROS: Pertinent items in HPI  OBJECTIVE Blood pressure 115/76, pulse 91, temperature 97.6 F (36.4 C), resp. rate 18, height  (1.651 m), weight 53.524 kg (118 lb). GENERAL: Well-developed, well-nourished female in mild distress.   HEENT: Normocephalic HEART: normal rate RESP: normal effort ABDOMEN: Soft, significant tenderness in RLQ, rebound tenderness in RLQ, no guarding EXTREMITIES: Nontender, no edema NEURO: Alert and oriented Pelvic exam: Cervix pink, visually closed, without lesion, scant white creamy discharge, vaginal walls and external genitalia normal Bimanual exam: Cervix 0/long/high, firm, anterior, positive CMT, uterus nontender, nonenlarged, significant tenderness in right adnexal region, difficult to palpate for mass or enlargement r/t pt discomfort, left adnexa wnl without tenderness  LAB RESULTS Results for orders placed or performed during the hospital encounter of 12/06/14 (from the past 24 hour(s))  Urinalysis, Routine w reflex microscopic     Status: Abnormal   Collection Time: 12/06/14  1:50 PM  Result Value Ref Range   Color, Urine YELLOW YELLOW   APPearance CLEAR CLEAR   Specific Gravity, Urine >1.030 (H) 1.005 - 1.030   pH 6.0 5.0 - 8.0   Glucose, UA NEGATIVE NEGATIVE mg/dL   Hgb urine dipstick NEGATIVE NEGATIVE   Bilirubin Urine NEGATIVE NEGATIVE   Ketones, ur NEGATIVE NEGATIVE mg/dL   Protein, ur NEGATIVE NEGATIVE mg/dL   Urobilinogen, UA 0.2 0.0 - 1.0 mg/dL   Nitrite NEGATIVE NEGATIVE   Leukocytes, UA NEGATIVE NEGATIVE  Pregnancy, urine POC     Status: None   Collection Time: 12/06/14  2:10 PM  Result Value Ref Range   Preg Test, Ur NEGATIVE NEGATIVE  Wet prep, genital  Status: Abnormal   Collection Time: 12/06/14  3:30 PM  Result Value Ref Range   Yeast Wet Prep HPF POC NONE SEEN NONE SEEN   Trich, Wet Prep NONE SEEN NONE SEEN   Clue Cells Wet Prep HPF POC NONE SEEN NONE SEEN   WBC, Wet Prep HPF POC FEW (A) NONE SEEN  CBC with Differential     Status: None   Collection Time: 12/06/14  3:53 PM  Result Value Ref Range   WBC 5.4 4.0 - 10.5 K/uL   RBC 4.01 3.87 - 5.11 MIL/uL   Hemoglobin 12.1 12.0 - 15.0 g/dL   HCT 16.1 09.6 - 04.5 %   MCV 95.3 78.0 - 100.0 fL   MCH  30.2 26.0 - 34.0 pg   MCHC 31.7 30.0 - 36.0 g/dL   RDW 40.9 81.1 - 91.4 %   Platelets 200 150 - 400 K/uL   Neutrophils Relative % 57 43 - 77 %   Neutro Abs 3.1 1.7 - 7.7 K/uL   Lymphocytes Relative 34 12 - 46 %   Lymphs Abs 1.8 0.7 - 4.0 K/uL   Monocytes Relative 5 3 - 12 %   Monocytes Absolute 0.3 0.1 - 1.0 K/uL   Eosinophils Relative 4 0 - 5 %   Eosinophils Absolute 0.2 0.0 - 0.7 K/uL   Basophils Relative 0 0 - 1 %   Basophils Absolute 0.0 0.0 - 0.1 K/uL    IMAGING US Transvaginal Non-ob  12/06/2014   CLINICAL DATA:  Right lower quadrant pain  EXAM: TRANSABDOMINAL AND TRANSVAGINAL ULTRASOUND OF PELVIS  TECHNIQUE: Both transabdominal and transvaginal ultrasound examinations of the pelvis were performed. Transabdominal technique was performed for global imaging of the pelvis including uterus, ovaries, adnexal regions, and pelvic cul-de-sac. It was necessary to proceed with endovaginal exam following the transabdominal exam to visualize the ovaries.  COMPARISON:  None  FINDINGS: Uterus  Measurements: 9.0 x 5.3 x 5.1 cm. The uterus is somewhat retroverted. No fibroids or other mass visualized.  Endometrium  Thickness: 9 mm.  No focal abnormality visualized.  Right ovary  Measurements: 4.1 x 1.3 x 2.3 cm. Follicular changes are noted.  Left ovary  Measurements: 4.3 x 2.7 x 3.4 cm. A dominant 1.5 cm follicle is noted.  Other findings  Minimal free pelvic fluid is noted. This is likely physiologic in nature.  IMPRESSION: Bilateral follicular changes with a dominant follicle on the left. No other focal abnormality is noted.   Electronically Signed   By: Alcide Clever M.D.   On: 12/06/2014 18:51   US Pelvis Complete  12/06/2014   CLINICAL DATA:  Right lower quadrant pain  EXAM: TRANSABDOMINAL AND TRANSVAGINAL ULTRASOUND OF PELVIS  TECHNIQUE: Both transabdominal and transvaginal ultrasound examinations of the pelvis were performed. Transabdominal technique was performed for global imaging of the pelvis  including uterus, ovaries, adnexal regions, and pelvic cul-de-sac. It was necessary to proceed with endovaginal exam following the transabdominal exam to visualize the ovaries.  COMPARISON:  None  FINDINGS: Uterus  Measurements: 9.0 x 5.3 x 5.1 cm. The uterus is somewhat retroverted. No fibroids or other mass visualized.  Endometrium  Thickness: 9 mm.  No focal abnormality visualized.  Right ovary  Measurements: 4.1 x 1.3 x 2.3 cm. Follicular changes are noted.  Left ovary  Measurements: 4.3 x 2.7 x 3.4 cm. A dominant 1.5 cm follicle is noted.  Other findings  Minimal free pelvic fluid is noted. This is likely physiologic in nature.  IMPRESSION: Bilateral follicular  changes with a dominant follicle on the left. No other focal abnormality is noted.   Electronically Signed   By: Alcide CleverMark  Lukens M.D.   On: 12/06/2014 18:51   MAU Management CBC, pelvic exam with cultures, Pelvic U/S, Toradol 60 mg IM  Pt reports full relief of pain with Toradol.  ASSESSMENT 1. RLQ abdominal pain   2. Hx of ovarian cyst     PLAN Discharge home Pt to call her primary care provider to follow up if pain persists Return to ED or MAU as needed for emergencies    Medication List    STOP taking these medications        promethazine 25 MG tablet  Commonly known as:  PHENERGAN      TAKE these medications        ibuprofen 600 MG tablet  Commonly known as:  ADVIL,MOTRIN  Take 1 tablet (600 mg total) by mouth every 6 (six) hours as needed.     loratadine 10 MG tablet  Commonly known as:  CLARITIN  Take 10 mg by mouth daily. For allergies       Follow-up Information    Please follow up.   Why:  Call your primary care provider this week to follow up      [facility]   Why:  As needed for emergencies   Contact information:   [address] [id number] [place] [phone]      Sharen CounterLisa Leftwich-Kirby Certified  Nurse-Midwife 12/06/2014  7:10 PM

## 2014-12-07 LAB — GC/CHLAMYDIA PROBE AMP (~~LOC~~) NOT AT ARMC
CHLAMYDIA, DNA PROBE: NEGATIVE
Neisseria Gonorrhea: NEGATIVE

## 2016-07-30 ENCOUNTER — Encounter: Payer: Self-pay | Admitting: Emergency Medicine

## 2016-07-30 ENCOUNTER — Emergency Department (HOSPITAL_COMMUNITY)
Admission: EM | Admit: 2016-07-30 | Discharge: 2016-07-31 | Disposition: A | Discharge status: Home / Self Care | Payer: Medicaid Other | Attending: Emergency Medicine | Admitting: Emergency Medicine

## 2016-07-30 ENCOUNTER — Emergency Department (HOSPITAL_COMMUNITY): Payer: Medicaid Other

## 2016-07-30 DIAGNOSIS — K5900 Constipation, unspecified: Secondary | ICD-10-CM | POA: Diagnosis not present

## 2016-07-30 DIAGNOSIS — Z79899 Other long term (current) drug therapy: Secondary | ICD-10-CM | POA: Diagnosis not present

## 2016-07-30 DIAGNOSIS — R1033 Periumbilical pain: Secondary | ICD-10-CM

## 2016-07-30 LAB — COMPREHENSIVE METABOLIC PANEL
ALT: 39 U/L (ref 14–54)
ANION GAP: 6 (ref 5–15)
AST: 29 U/L (ref 15–41)
Albumin: 4.9 g/dL (ref 3.5–5.0)
Alkaline Phosphatase: 75 U/L (ref 38–126)
BUN: 12 mg/dL (ref 6–20)
CO2: 27 mmol/L (ref 22–32)
CREATININE: 0.73 mg/dL (ref 0.44–1.00)
Calcium: 8.7 mg/dL — ABNORMAL LOW (ref 8.9–10.3)
Chloride: 105 mmol/L (ref 101–111)
GFR calc non Af Amer: >60 mL/min (ref >60)
Glucose, Bld: 97 mg/dL (ref 65–99)
POTASSIUM: 3.8 mmol/L (ref 3.5–5.1)
SODIUM: 138 mmol/L (ref 135–145)
Total Bilirubin: 0.9 mg/dL (ref 0.3–1.2)
Total Protein: 7.8 g/dL (ref 6.5–8.1)

## 2016-07-30 LAB — CBC
HCT: 39.7 % (ref 36.0–46.0)
HEMOGLOBIN: 12.9 g/dL (ref 12.0–15.0)
MCH: 30.2 pg (ref 26.0–34.0)
MCHC: 32.5 g/dL (ref 30.0–36.0)
MCV: 93 fL (ref 78.0–100.0)
Platelets: 169 10*3/uL (ref 150–400)
RBC: 4.27 MIL/uL (ref 3.87–5.11)
RDW: 12.5 % (ref 11.5–15.5)
WBC: 3.1 10*3/uL — ABNORMAL LOW (ref 4.0–10.5)

## 2016-07-30 LAB — POC URINE PREG, ED: Preg Test, Ur: NEGATIVE

## 2016-07-30 LAB — URINALYSIS, ROUTINE W REFLEX MICROSCOPIC
BILIRUBIN URINE: NEGATIVE
Glucose, UA: NEGATIVE mg/dL
Hgb urine dipstick: NEGATIVE
Ketones, ur: NEGATIVE mg/dL
Leukocytes, UA: NEGATIVE
Nitrite: NEGATIVE
Protein, ur: NEGATIVE mg/dL
SPECIFIC GRAVITY, URINE: 1.024 (ref 1.005–1.030)
pH: 6 (ref 5.0–8.0)

## 2016-07-30 LAB — LIPASE, BLOOD: LIPASE: 28 U/L (ref 11–51)

## 2016-07-30 MED ORDER — MORPHINE SULFATE (PF) 4 MG/ML IV SOLN
4.0000 mg | Freq: Once | INTRAVENOUS | Status: AC
  Administered 2016-07-30: 4 mg via INTRAVENOUS
  Filled 2016-07-30: qty 1

## 2016-07-30 MED ORDER — SODIUM CHLORIDE 0.9 % IJ SOLN
INTRAMUSCULAR | Status: AC
  Administered 2016-07-30: 23:00:00
  Filled 2016-07-30: qty 50

## 2016-07-30 MED ORDER — IOPAMIDOL (ISOVUE-300) INJECTION 61%
INTRAVENOUS | Status: AC
  Filled 2016-07-30: qty 100

## 2016-07-30 MED ORDER — IOPAMIDOL (ISOVUE-300) INJECTION 61%
INTRAVENOUS | Status: DC
Start: 2016-07-30 — End: 2016-07-31
  Filled 2016-07-30: qty 30

## 2016-07-30 MED ORDER — ONDANSETRON HCL 4 MG/2ML IJ SOLN
4.0000 mg | Freq: Once | INTRAMUSCULAR | Status: AC
  Administered 2016-07-30: 4 mg via INTRAVENOUS
  Filled 2016-07-30: qty 2

## 2016-07-30 MED ORDER — IOPAMIDOL (ISOVUE-300) INJECTION 61%
100.0000 mL | Freq: Once | INTRAVENOUS | Status: AC | PRN
  Administered 2016-07-30: 100 mL via INTRAVENOUS

## 2016-07-30 NOTE — ED Triage Notes (Signed)
Pt complains of RUQ pain and decreased appetite for the past 2 days. Pt denies n/v/d. Pt states she has been coughing since Thursday.

## 2016-07-30 NOTE — ED Provider Notes (Signed)
[facility]-EMERGENCY DEPT Provider Note   CSN: [id number] Arrival date & time: 07/30/16  1942     History   Chief Complaint Chief Complaint  Patient presents with  . Abdominal Pain    HPI Kristen French is a 25 y.o. female.  HPI  Pt presenting with c/o abdominal pain.  She states the pain starts around her belly button, there is an area of swelling there that is not normally there. She started noticing pain 2 days ago and it has become more intense.  Pain is constant.  Pain is around umbilicus and into epigastric region.  She feels like her stomach is distended.  She has had no nausea or vomiting.  No fever/chills.  No diarrhea.  Has not had any treatment prior to arrival.  She does not know of a history of having a hernia in the past.  There are no other associated systemic symptoms, there are no other alleviating or modifying factors.   Past Medical History:  Diagnosis Date  . No pertinent past medical history     There are no active problems to display for this patient.   Past Surgical History:  Procedure Laterality Date  . NO PAST SURGERIES      OB History    Gravida Para Term Preterm AB Living   2 2 2  0 0 2   SAB TAB Ectopic Multiple Live Births   0 0 0 0         Home Medications    Prior to Admission medications   Medication Sig Start Date End Date Taking? Authorizing Provider  acetaminophen (TYLENOL) 500 MG tablet Take 1,000 mg by mouth every 6 (six) hours as needed for mild pain, moderate pain or headache.   Yes Historical Provider, MD  polyethylene glycol (MIRALAX / GLYCOLAX) packet Take 17 g by mouth daily. 07/31/16   Jerelyn ScottMartha Linker, MD    Family History Family History  Problem Relation Age of Onset  . Anesthesia problems Neg Hx   . Hypotension Neg Hx   . Malignant hyperthermia Neg Hx   . Pseudochol deficiency Neg Hx     Social History Social History  Substance Use Topics  . Smoking status: Never Smoker  . Smokeless tobacco: Never Used  .  Alcohol use No     Allergies   Patient has no known allergies.   Review of Systems Review of Systems  ROS reviewed and all otherwise negative except for mentioned in HPI   Physical Exam Updated Vital Signs BP 117/76   Pulse 67   Temp 98.2 F (36.8 C) (Oral)   Resp 18   LMP 07/08/2016   SpO2 100%  Vitals reviewed Physical Exam Physical Examination: General appearance - alert, well appearing, and in no distress Mental status - alert, oriented to person, place, and time Eyes - no conjunctival injection, no scleral icterus Mouth - mucous membranes moist, pharynx normal without lesions Chest - clear to auscultation, no wheezes, rales or rhonchi, symmetric air entry Heart - normal rate, regular rhythm, normal S1, S2, no murmurs, rubs, clicks or gallops Abdomen - soft, ttp in periumblicial region, there is one area in umblicus that she point to as being exquisitely tender- may be a small umbilical hernia- she cannot tolerate palpation enough for me to attempt to reduce or palpate further, nabs, nondistended, no masses or organomegaly Neurological - alert, oriented, normal speech, no focal findings or movement disorder noted Extremities - peripheral pulses normal, no pedal edema, no clubbing or cyanosis  Skin - normal coloration and turgor, no rashes, no suspicious skin lesions noted  ED Treatments / Results  Labs (all labs ordered are listed, but only abnormal results are displayed) Labs Reviewed  COMPREHENSIVE METABOLIC PANEL - Abnormal; Notable for the following:       Result Value   Calcium 8.7 (*)    All other components within normal limits  CBC - Abnormal; Notable for the following:    WBC 3.1 (*)    All other components within normal limits  URINALYSIS, ROUTINE W REFLEX MICROSCOPIC  LIPASE, BLOOD  POC URINE PREG, ED    EKG  EKG Interpretation None       Radiology No results found.  Procedures Procedures (including critical care time)  Medications Ordered  in ED Medications  morphine 4 MG/ML injection 4 mg (4 mg Intravenous Given 07/30/16 2229)  ondansetron (ZOFRAN) injection 4 mg (4 mg Intravenous Given 07/30/16 2228)  iopamidol (ISOVUE-300) 61 % injection 100 mL (100 mLs Intravenous Contrast Given 07/30/16 2301)  sodium chloride 0.9 % injection (  Given by Other 07/30/16 2301)     Initial Impression / Assessment and Plan / ED Course  I have reviewed the triage vital signs and the nursing notes.  Pertinent labs & imaging results that were available during my care of the patient were reviewed by me and considered in my medical decision making (see chart for details).  Clinical Course     Pt presenting with abdominal pain,  Based on exam I had concern for possible incarcerated small umblical hernia as there was an area of exqusite tenderness and small protrusion at umblicus.  CT scan shows only constipation.  Pt started on miralax.  Discharged with strict return precautions.  Pt agreeable with plan.  Final Clinical Impressions(s) / ED Diagnoses   Final diagnoses:  Periumbilical abdominal pain  Constipation, unspecified constipation type    New Prescriptions Discharge Medication List as of 07/31/2016 12:37 AM    START taking these medications   Details  polyethylene glycol (MIRALAX / GLYCOLAX) packet Take 17 g by mouth daily., Starting Mon 07/31/2016, Print         Jerelyn ScottMartha Linker, MD 08/02/16 [phone]

## 2016-07-31 MED ORDER — POLYETHYLENE GLYCOL 3350 17 G PO PACK: 17.0000 g | PACK | Freq: Every day | ORAL | 0 refills | Status: AC

## 2016-07-31 NOTE — Discharge Instructions (Signed)
Return to the ED with any concerns including vomiting and not able to keep down liquids, difficulty breathing, fainting, decreased level of alertness/lethargy, or any other alarming symptoms °

## 2018-06-26 ENCOUNTER — Emergency Department (HOSPITAL_COMMUNITY)
Admission: EM | Admit: 2018-06-26 | Discharge: 2018-06-26 | Disposition: A | Discharge status: Home / Self Care | Payer: Self-pay | Attending: Emergency Medicine | Admitting: Emergency Medicine

## 2018-06-26 ENCOUNTER — Other Ambulatory Visit: Payer: Self-pay

## 2018-06-26 ENCOUNTER — Encounter (HOSPITAL_COMMUNITY): Payer: Self-pay | Admitting: Emergency Medicine

## 2018-06-26 DIAGNOSIS — J029 Acute pharyngitis, unspecified: Secondary | ICD-10-CM | POA: Insufficient documentation

## 2018-06-26 LAB — GROUP A STREP BY PCR: Group A Strep by PCR: NOT DETECTED

## 2018-06-26 NOTE — ED Triage Notes (Signed)
Pt c/o sore throat and pain with swallowing x 1 day. No fevers at home.

## 2018-06-26 NOTE — Discharge Instructions (Signed)
Take Ibuprofen or Tylenol for pain Drink cool drinks to help with pain Return if you are worsening

## 2018-06-26 NOTE — ED Notes (Signed)
Pt stable, ambulatory, states understanding of discharge instructions 

## 2018-06-26 NOTE — ED Provider Notes (Signed)
[facility] ([facility]) EMERGENCY DEPARTMENT Provider Note   CSN: [id number] Arrival date & time: 06/26/18  1840     History   Chief Complaint Chief Complaint  Patient presents with  . Sore Throat    HPI Kristen French is a 27 y.o. female who presents with sore throat.  No significant past medical history.  Patient states that she had an acute onset of a sore throat since yesterday.  She denies fever, ear pain, runny nose, congestion, cough.  She is an occasional smoker.  She also reports some issues with acid reflux.  She was encouraged by her job to come here to make sure she does not have strep.  HPI  Past Medical History:  Diagnosis Date  . No pertinent past medical history     There are no active problems to display for this patient.   Past Surgical History:  Procedure Laterality Date  . NO PAST SURGERIES       OB History    Gravida  2   Para  2   Term  2   Preterm  0   AB  0   Living  2     SAB  0   TAB  0   Ectopic  0   Multiple  0   Live Births               Home Medications    Prior to Admission medications   Medication Sig Start Date End Date Taking? Authorizing Provider  acetaminophen (TYLENOL) 500 MG tablet Take 1,000 mg by mouth every 6 (six) hours as needed for mild pain, moderate pain or headache.    [provider]  polyethylene glycol (MIRALAX / GLYCOLAX) packet Take 17 g by mouth daily. 07/31/16   MabeLatanya Maudlin, MD    Family History Family History  Problem Relation Age of Onset  . Anesthesia problems Neg Hx   . Hypotension Neg Hx   . Malignant hyperthermia Neg Hx   . Pseudochol deficiency Neg Hx     Social History Social History   Tobacco Use  . Smoking status: Never Smoker  . Smokeless tobacco: Never Used  Substance Use Topics  . Alcohol use: No  . Drug use: No     Allergies   Patient has no known allergies.   Review of Systems Review of Systems  Constitutional: Negative for fever.   HENT: Positive for sore throat.      Physical Exam Updated Vital Signs BP 113/81 (BP Location: Right Arm)   Pulse 68   Temp 98.3 F (36.8 C) (Oral)   Resp 16   SpO2 100%   Physical Exam  Constitutional: She is oriented to person, place, and time. She appears well-developed and well-nourished. No distress.  Calm, cooperative, well-appearing  HENT:  Head: Normocephalic and atraumatic.  Right Ear: Hearing, tympanic membrane, external ear and ear canal normal.  Left Ear: Hearing, tympanic membrane, external ear and ear canal normal.  Nose: Nose normal.  Mouth/Throat: Uvula is midline and mucous membranes are normal. Posterior oropharyngeal erythema present.  Eyes: Pupils are equal, round, and reactive to light. Conjunctivae are normal. Right eye exhibits no discharge. Left eye exhibits no discharge. No scleral icterus.  Neck: Normal range of motion.  Cardiovascular: Normal rate and regular rhythm.  Pulmonary/Chest: Effort normal and breath sounds normal. No respiratory distress.  Abdominal: She exhibits no distension.  Neurological: She is alert and oriented to person, place, and time.  Skin:  Skin is warm and dry.  Psychiatric: She has a normal mood and affect. Her behavior is normal.  Nursing note and vitals reviewed.    ED Treatments / Results  Labs (all labs ordered are listed, but only abnormal results are displayed) Labs Reviewed  GROUP A STREP BY PCR    EKG None  Radiology No results found.  Procedures Procedures (including critical care time)  Medications Ordered in ED Medications - No data to display   Initial Impression / Assessment and Plan / ED Course  I have reviewed the triage vital signs and the nursing notes.  Pertinent labs & imaging results that were available during my care of the patient were reviewed by me and considered in my medical decision making (see chart for details).  27 year old female with sore throat for 1 day.  Her vital signs  are normal.  She is well-appearing.  Strep is negative.  Advised supportive care and to return if worsening.  Final Clinical Impressions(s) / ED Diagnoses   Final diagnoses:  Pharyngitis, unspecified etiology    ED Discharge Orders    None       Bethel Born, PA-C 06/26/18 2237    Raeford Razor, MD 06/27/18 1002

## 2018-07-02 ENCOUNTER — Other Ambulatory Visit: Payer: Self-pay

## 2018-07-02 ENCOUNTER — Emergency Department
Admission: EM | Admit: 2018-07-02 | Discharge: 2018-07-02 | Disposition: A | Discharge status: Home / Self Care | Payer: Self-pay | Attending: Emergency Medicine | Admitting: Emergency Medicine

## 2018-07-02 ENCOUNTER — Encounter: Payer: Self-pay | Admitting: Emergency Medicine

## 2018-07-02 ENCOUNTER — Emergency Department: Payer: Self-pay

## 2018-07-02 DIAGNOSIS — Z79899 Other long term (current) drug therapy: Secondary | ICD-10-CM | POA: Insufficient documentation

## 2018-07-02 DIAGNOSIS — J209 Acute bronchitis, unspecified: Secondary | ICD-10-CM | POA: Insufficient documentation

## 2018-07-02 MED ORDER — AZITHROMYCIN 250 MG PO TABS: ORAL_TABLET | ORAL | 0 refills | Status: AC

## 2018-07-02 MED ORDER — BENZONATATE 200 MG PO CAPS: 200.0000 mg | ORAL_CAPSULE | Freq: Three times a day (TID) | ORAL | 0 refills | Status: AC | PRN

## 2018-07-02 NOTE — Discharge Instructions (Addendum)
Follow-up with your regular doctor if not better in 3 days.  Return emergency department worsening.  Use the antibiotic and cough as prescribed.  Drink plenty of fluids.  Tylenol and ibuprofen for fever as needed.

## 2018-07-02 NOTE — ED Notes (Signed)
See triage note  Presents with sore throat and cough  States she developed some sore throat last weds/thursday  Was seen and dx'd with viral illness states she was tested for strept and it was negative  But states she has had intermittent hot flashes ,cough and feels like her throat is on fire

## 2018-07-02 NOTE — ED Triage Notes (Signed)
Pt in via POV, complaints of sore throat, cough x one week.  Pt [place] being seen in [place] last week and dx w/ viral illness.  Pt reports no relief with OTC medication.  Pt afebrile, vitals WDL at this time.

## 2018-07-02 NOTE — ED Provider Notes (Signed)
[facility] Emergency Department Provider Note  ____________________________________________   First MD Initiated Contact with Patient 07/02/18 1121     (approximate)  I have reviewed the triage vital signs and the nursing notes.   HISTORY  Chief Complaint URI    HPI Kristen French is a 27 y.o. female presents emergency department complaining of sore throat and cough.  Symptoms have been ongoing for at least 1 week.  She was seen at [facility] and diagnosed with a viral illness.  She was tested for strep throat which was negative.  She states she keeps having intermittent hot flashes feeling hot and cold.  Her cough is productive with yellow mucus.  She denies chest pain or shortness of breath.    Past Medical History:  Diagnosis Date  . No pertinent past medical history     There are no active problems to display for this patient.   Past Surgical History:  Procedure Laterality Date  . NO PAST SURGERIES      Prior to Admission medications   Medication Sig Start Date End Date Taking? Authorizing Provider  acetaminophen (TYLENOL) 500 MG tablet Take 1,000 mg by mouth every 6 (six) hours as needed for mild pain, moderate pain or headache.    [provider]  azithromycin (ZITHROMAX Z-PAK) 250 MG tablet 2 pills today then 1 pill a day for 4 days 07/02/18   Sherrie Mustache, Roselyn Bering, PA-C  benzonatate (TESSALON) 200 MG capsule Take 1 capsule (200 mg total) by mouth 3 (three) times daily as needed for cough. 07/02/18   Gurjot Brisco, Roselyn Bering, PA-C  polyethylene glycol (MIRALAX / Ethelene Hal) packet Take 17 g by mouth daily. 07/31/16   Mabe, Latanya Maudlin, MD    Allergies Patient has no known allergies.  Family History  Problem Relation Age of Onset  . Anesthesia problems Neg Hx   . Hypotension Neg Hx   . Malignant hyperthermia Neg Hx   . Pseudochol deficiency Neg Hx     Social History Social History   Tobacco Use  . Smoking status: Never Smoker  .  Smokeless tobacco: Never Used  Substance Use Topics  . Alcohol use: No  . Drug use: No    Review of Systems  Constitutional: Unsure fever/chills but did feel hot and cold Eyes: No visual changes. ENT: Positive sore throat. Respiratory: Positive cough Genitourinary: Negative for dysuria. Musculoskeletal: Negative for back pain. Skin: Negative for rash.    ____________________________________________   PHYSICAL EXAM:  VITAL SIGNS: ED Triage Vitals [07/02/18 1114]  Enc Vitals Group     BP 116/82     Pulse Rate 85     Resp 16     Temp 98.2 F (36.8 C)     Temp Source Oral     SpO2 100 %     Weight 132 lb (59.9 kg)     Height 5\' 5"  (1.651 m)     Head Circumference      Peak Flow      Pain Score 8     Pain Loc      Pain Edu?      Excl. in GC?     Constitutional: Alert and oriented. Well appearing and in no acute distress. Eyes: Conjunctivae are normal.  Head: Atraumatic. Nose: No congestion/rhinnorhea. Mouth/Throat: Mucous membranes are moist.  Throat is irritated but no redness or pus is noted Neck:  supple no lymphadenopathy noted Cardiovascular: Normal rate, regular rhythm. Heart sounds are normal Respiratory: Normal respiratory  effort.  No retractions, lungs c t a, cough is dry and hacking GU: deferred Musculoskeletal: FROM all extremities, warm and well perfused Neurologic:  Normal speech and language.  Skin:  Skin is warm, dry and intact. No rash noted. Psychiatric: Mood and affect are normal. Speech and behavior are normal.  ____________________________________________   LABS (all labs ordered are listed, but only abnormal results are displayed)  Labs Reviewed - No data to display ____________________________________________   ____________________________________________  RADIOLOGY  Chest x-ray is negative  ____________________________________________   PROCEDURES  Procedure(s) performed:  No  Procedures    ____________________________________________   INITIAL IMPRESSION / ASSESSMENT AND PLAN / ED COURSE  Pertinent labs & imaging results that were available during my care of the patient were reviewed by me and considered in my medical decision making (see chart for details).   Patient is a 27 year old female presents emergency department complaining of upper respiratory symptoms.  On physical exam patient appears well.  She does feel warm to touch.  Throat is irritated and the cough is dry and hacking.  Remainder the exam is unremarkable.  Chest x-ray is negative.  Explained chest x-ray results to the patient.  She was given a prescription for Z-Pak and Tessalon Perles.  She is to follow-up with her regular doctor if not better in 3 to 5 days.  Return emergency department worsening.  She states she understands will comply.  She was discharged in stable condition.     As part of my medical decision making, I reviewed the following data within the electronic MEDICAL RECORD NUMBER Nursing notes reviewed and incorporated, Old chart reviewed, Radiograph reviewed chest x-ray is negative, Notes from prior ED visits and [place] Controlled Substance Database  ____________________________________________   FINAL CLINICAL IMPRESSION(S) / ED DIAGNOSES  Final diagnoses:  Acute bronchitis, unspecified organism      NEW MEDICATIONS STARTED DURING THIS VISIT:  Discharge Medication List as of 07/02/2018 12:43 PM    START taking these medications   Details  azithromycin (ZITHROMAX Z-PAK) 250 MG tablet 2 pills today then 1 pill a day for 4 days, Normal    benzonatate (TESSALON) 200 MG capsule Take 1 capsule (200 mg total) by mouth 3 (three) times daily as needed for cough., Starting Tue 07/02/2018, Normal         Note:  This document was prepared using Dragon voice recognition software and may include unintentional dictation errors.    Faythe Ghee, PA-C 07/02/18 1410     Dionne Bucy, MD 07/02/18 1549

## 2018-10-23 IMAGING — CT CT ABD-PELV W/ CM
2 of 4 series · 16 of 46 positions shown, 18 images · IV contrast (ISOVUE)
Comparison: None.

CLINICAL DATA: Right upper quadrant pain and decreased appetite.
Umbilical hernia.

EXAM:
CT ABDOMEN AND PELVIS WITH CONTRAST
TECHNIQUE: Multidetector CT imaging of the abdomen and pelvis was performed
using the standard protocol following bolus administration of
intravenous contrast.
CONTRAST:  100mL [id number] IOPAMIDOL ([id number]) INJECTION 61%

[Series 2: abd/pel with · axial · 0.74mm/px · z∈[-330,+60]mm · 13 of 88 slices shown, 15 images]
[im 5/88  soft-tissue]
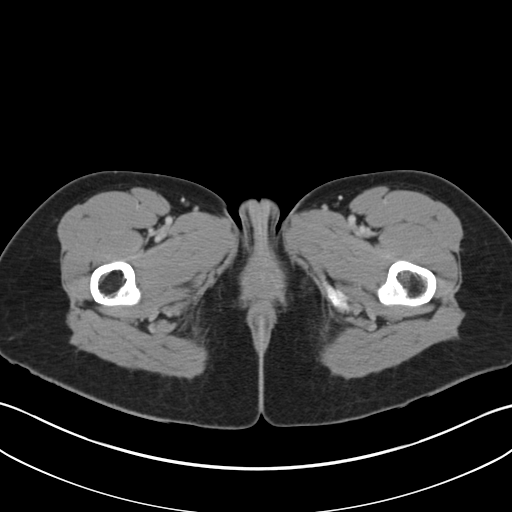
[im 5/88  bone]
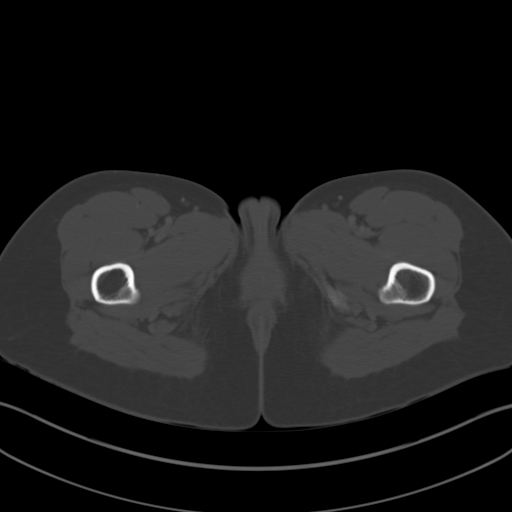
[im 10/88  soft-tissue]
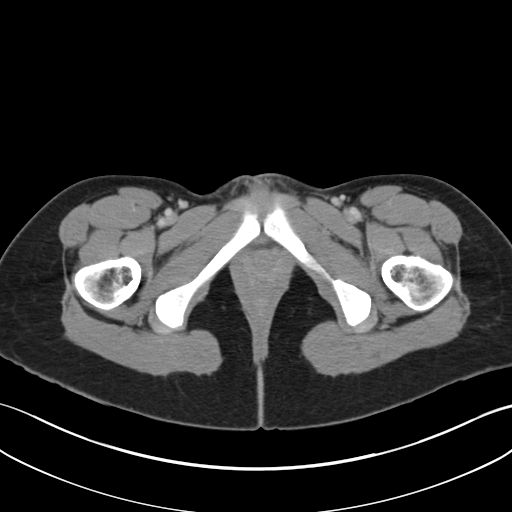
[im 20/88  soft-tissue]
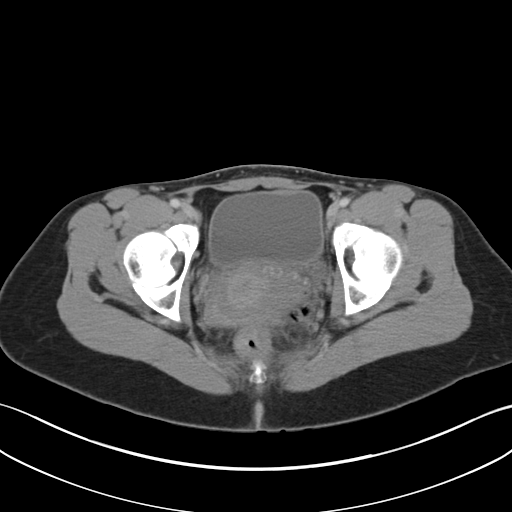
[im 25/88  soft-tissue]
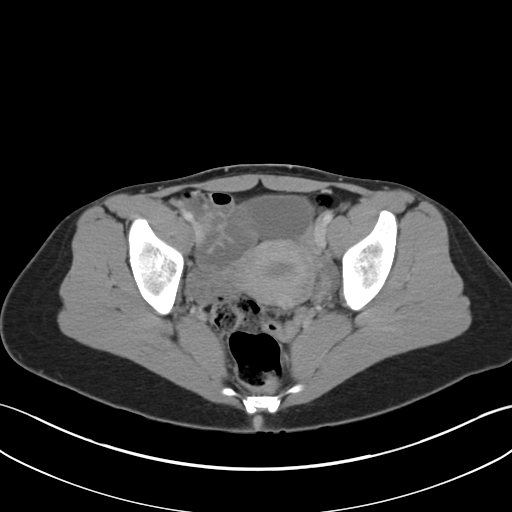
[im 30/88  soft-tissue]
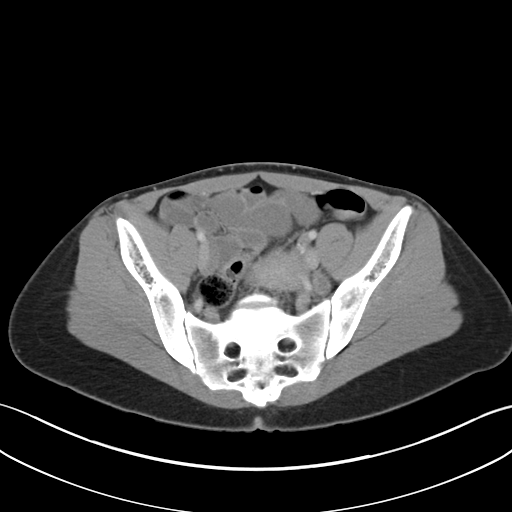
[im 39/88  soft-tissue]
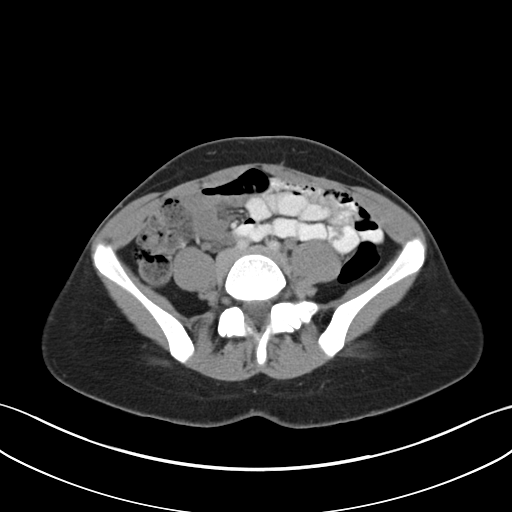
[im 44/88  soft-tissue]
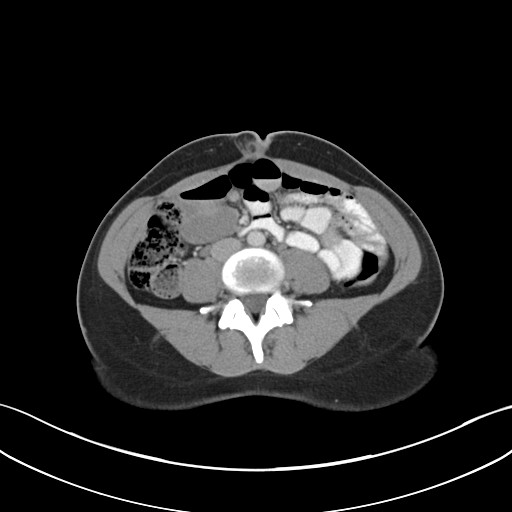
[im 49/88  soft-tissue]
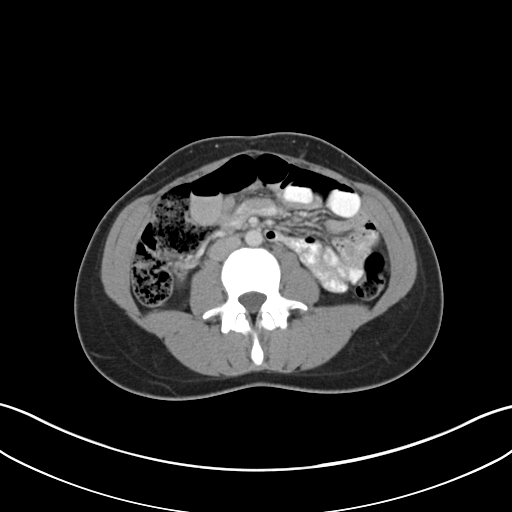
[im 59/88  soft-tissue]
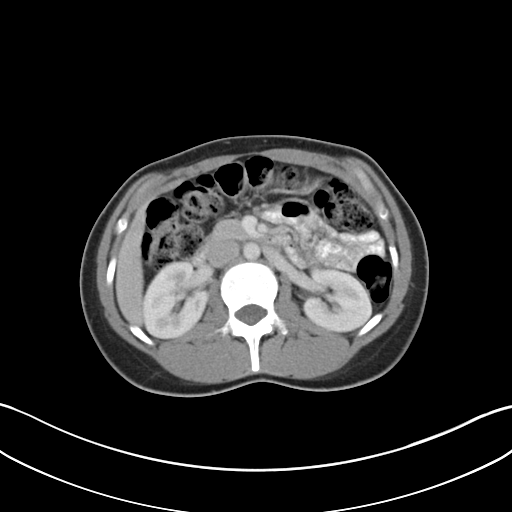
[im 59/88  bone]
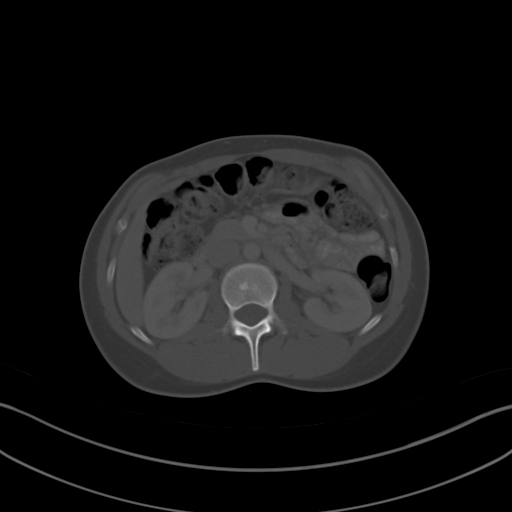
[im 63/88  soft-tissue]
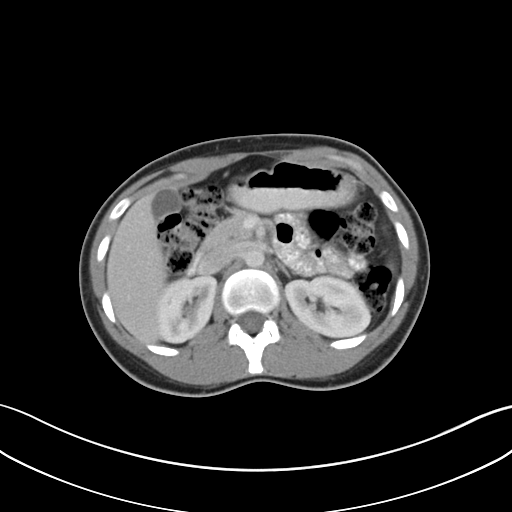
[im 68/88  soft-tissue]
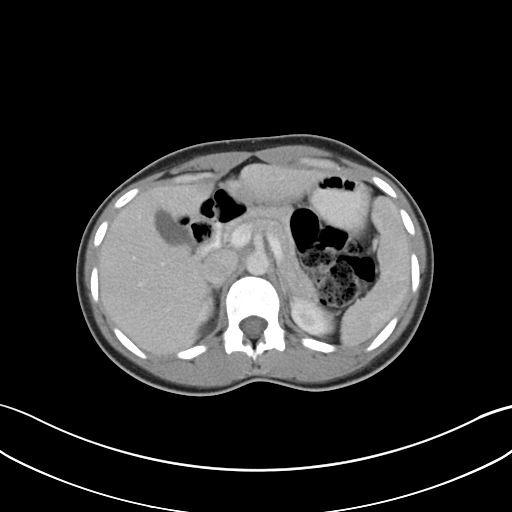
[im 78/88  soft-tissue]
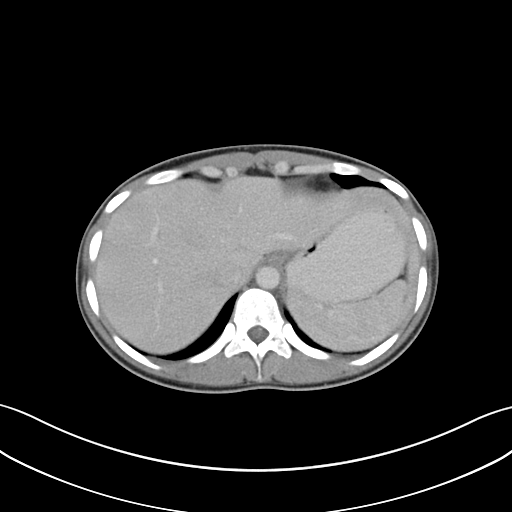
[im 83/88  soft-tissue]
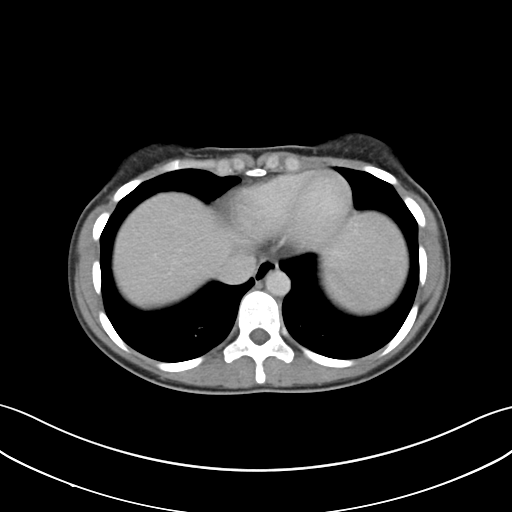

[Series 3: coronal a/|p · coronal · 0.76mm/px · 3 of 112 slices shown]
[im 38/112  soft-tissue]
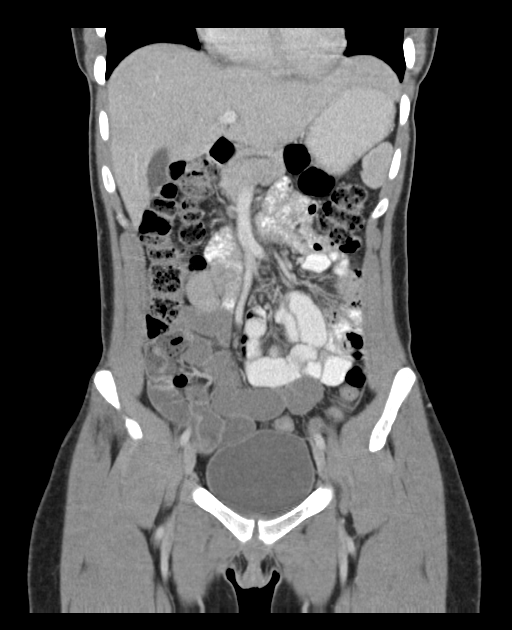
[im 50/112  soft-tissue]
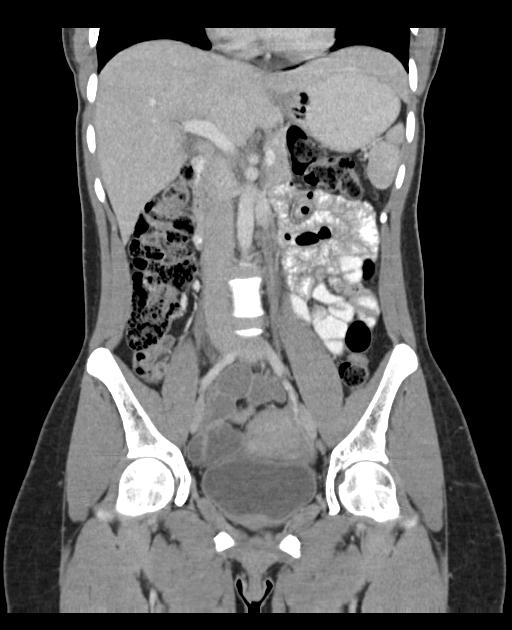
[im 62/112  soft-tissue]
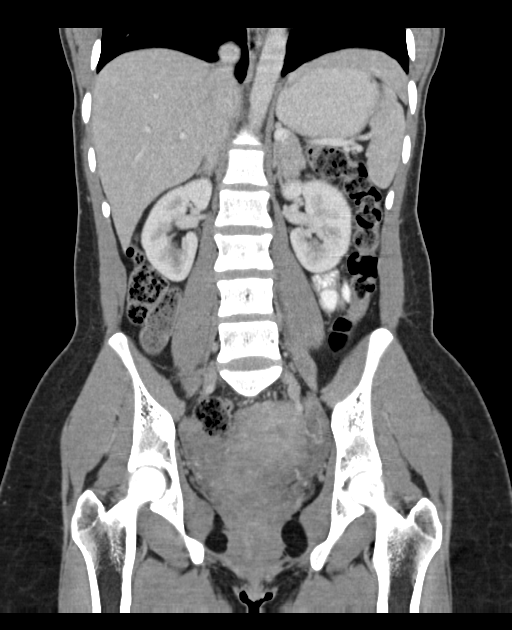

[16 of 46 positions shown; findings below may reference images not displayed]

FINDINGS: LOWER CHEST: Lung bases are clear. Included heart size is normal. No
pericardial effusion.

HEPATOBILIARY: Liver and gallbladder are normal. No gallstones or
biliary dilatation.

PANCREAS: Normal.  No peripancreatic inflammation or focal mass.

SPLEEN: Normal.

ADRENALS/URINARY TRACT: Kidneys are orthotopic, demonstrating
symmetric enhancement. No nephrolithiasis, hydronephrosis or solid
renal masses. The unopacified ureters are normal in course and
caliber. Urinary bladder is partially distended and unremarkable.
Normal adrenal glands.

STOMACH/BOWEL: The stomach, small and large bowel are normal in
course and caliber without inflammatory changes. Moderate to marked
amount of fecal retention within large bowel without acute
inflammation. Normal appendix.

VASCULAR/LYMPHATIC: Aortoiliac vessels are normal in course and
caliber. No lymphadenopathy by CT size criteria.

REPRODUCTIVE: Hemorrhagic cyst or corpus luteum in the left ovary
measuring 17 mm. Normal appearing uterus.

OTHER: No intraperitoneal free fluid or free air.

MUSCULOSKELETAL: Nonacute. Pseudoarticulation of L5 with S1 on the
left. Small fat containing umbilical hernia.
IMPRESSION: Moderate to marked amount of fecal retention within large bowel
consistent with constipation.

Left ovarian hemorrhagic cyst or corpus luteum of incidental note.

## 2019-10-13 ENCOUNTER — Ambulatory Visit: Payer: Medicaid Other | Attending: Internal Medicine

## 2019-10-13 DIAGNOSIS — Z20822 Contact with and (suspected) exposure to covid-19: Secondary | ICD-10-CM

## 2019-10-14 LAB — NOVEL CORONAVIRUS, NAA: SARS-CoV-2, NAA: NOT DETECTED
